# Patient Record
Sex: Female | Born: 1955 | Race: White | Hispanic: No | Marital: Married | Smoking: Never smoker
Health system: Southern US, Community
[De-identification: ages and names within clinical notes are randomized; demographics above are authoritative.]

## PROBLEM LIST (undated history)

## (undated) DIAGNOSIS — E781 Pure hyperglyceridemia: Secondary | ICD-10-CM

## (undated) DIAGNOSIS — I10 Essential (primary) hypertension: Secondary | ICD-10-CM

## (undated) DIAGNOSIS — E785 Hyperlipidemia, unspecified: Secondary | ICD-10-CM

## (undated) HISTORY — PX: OTHER SURGICAL HISTORY: SHX169

## (undated) HISTORY — PX: TONSILLECTOMY AND ADENOIDECTOMY: SHX28

---

## 2010-05-09 ENCOUNTER — Ambulatory Visit: Payer: Self-pay | Admitting: Cardiology

## 2014-12-07 ENCOUNTER — Encounter (HOSPITAL_COMMUNITY): Payer: Self-pay

## 2014-12-07 ENCOUNTER — Inpatient Hospital Stay (HOSPITAL_COMMUNITY): Payer: PRIVATE HEALTH INSURANCE

## 2014-12-07 ENCOUNTER — Inpatient Hospital Stay (HOSPITAL_COMMUNITY): Payer: PRIVATE HEALTH INSURANCE | Admitting: Anesthesiology

## 2014-12-07 ENCOUNTER — Inpatient Hospital Stay (HOSPITAL_COMMUNITY)
Admission: AD | Admit: 2014-12-07 | Discharge: 2014-12-09 | DRG: 494 | Disposition: A | Discharge status: Home / Self Care | Payer: PRIVATE HEALTH INSURANCE | Source: Ambulatory Visit | Attending: Orthopedic Surgery | Admitting: Orthopedic Surgery

## 2014-12-07 ENCOUNTER — Encounter (HOSPITAL_COMMUNITY)
Admission: AD | Disposition: A | Discharge status: Home / Self Care | Payer: Self-pay | Source: Ambulatory Visit | Attending: Orthopedic Surgery

## 2014-12-07 DIAGNOSIS — S82852A Displaced trimalleolar fracture of left lower leg, initial encounter for closed fracture: Secondary | ICD-10-CM | POA: Diagnosis present

## 2014-12-07 DIAGNOSIS — R05 Cough: Secondary | ICD-10-CM

## 2014-12-07 DIAGNOSIS — S82892A Other fracture of left lower leg, initial encounter for closed fracture: Secondary | ICD-10-CM

## 2014-12-07 DIAGNOSIS — M25572 Pain in left ankle and joints of left foot: Secondary | ICD-10-CM | POA: Diagnosis present

## 2014-12-07 DIAGNOSIS — R059 Cough, unspecified: Secondary | ICD-10-CM

## 2014-12-07 DIAGNOSIS — E785 Hyperlipidemia, unspecified: Secondary | ICD-10-CM | POA: Diagnosis present

## 2014-12-07 DIAGNOSIS — W19XXXA Unspecified fall, initial encounter: Secondary | ICD-10-CM | POA: Diagnosis present

## 2014-12-07 DIAGNOSIS — I10 Essential (primary) hypertension: Secondary | ICD-10-CM | POA: Diagnosis present

## 2014-12-07 HISTORY — PX: ORIF ANKLE FRACTURE: SHX5408

## 2014-12-07 HISTORY — DX: Pure hyperglyceridemia: E78.1

## 2014-12-07 HISTORY — DX: Essential (primary) hypertension: I10

## 2014-12-07 HISTORY — DX: Hyperlipidemia, unspecified: E78.5

## 2014-12-07 LAB — SURGICAL PCR SCREEN
MRSA, PCR: NEGATIVE
STAPHYLOCOCCUS AUREUS: NEGATIVE

## 2014-12-07 SURGERY — OPEN REDUCTION INTERNAL FIXATION (ORIF) ANKLE FRACTURE
Anesthesia: General | Site: Ankle | Laterality: Left

## 2014-12-07 MED ORDER — FENTANYL CITRATE (PF) 250 MCG/5ML IJ SOLN
INTRAMUSCULAR | Status: AC
  Filled 2014-12-07: qty 5

## 2014-12-07 MED ORDER — ONDANSETRON HCL 4 MG/2ML IJ SOLN
4.0000 mg | Freq: Four times a day (QID) | INTRAMUSCULAR | Status: DC | PRN
  Administered 2014-12-07 – 2014-12-09 (×3): 4 mg via INTRAVENOUS
  Filled 2014-12-07 (×3): qty 2

## 2014-12-07 MED ORDER — CEFAZOLIN SODIUM-DEXTROSE 2-3 GM-% IV SOLR
2.0000 g | INTRAVENOUS | Status: AC
  Administered 2014-12-07: 2 g via INTRAVENOUS

## 2014-12-07 MED ORDER — ALUM & MAG HYDROXIDE-SIMETH 200-200-20 MG/5ML PO SUSP
30.0000 mL | ORAL | Status: DC | PRN
  Administered 2014-12-09: 30 mL via ORAL
  Filled 2014-12-07: qty 30

## 2014-12-07 MED ORDER — OXYCODONE-ACETAMINOPHEN 5-325 MG PO TABS
1.0000 | ORAL_TABLET | ORAL | Status: DC | PRN
  Administered 2014-12-07 – 2014-12-09 (×6): 1 via ORAL
  Filled 2014-12-07 (×6): qty 1

## 2014-12-07 MED ORDER — FENTANYL CITRATE (PF) 100 MCG/2ML IJ SOLN: 25.0000 ug | INTRAMUSCULAR | Status: DC | PRN

## 2014-12-07 MED ORDER — NEOSTIGMINE METHYLSULFATE 10 MG/10ML IV SOLN
INTRAVENOUS | Status: DC | PRN
  Administered 2014-12-07 (×2): 2 mg via INTRAVENOUS

## 2014-12-07 MED ORDER — GLYCOPYRROLATE 0.2 MG/ML IJ SOLN
INTRAMUSCULAR | Status: DC | PRN
  Administered 2014-12-07 (×2): 0.3 mg via INTRAVENOUS

## 2014-12-07 MED ORDER — FENOFIBRATE 54 MG PO TABS
54.0000 mg | ORAL_TABLET | Freq: Every day | ORAL | Status: DC
  Administered 2014-12-08 – 2014-12-09 (×2): 54 mg via ORAL
  Filled 2014-12-07 (×6): qty 1

## 2014-12-07 MED ORDER — CEFAZOLIN SODIUM-DEXTROSE 2-3 GM-% IV SOLR
2.0000 g | Freq: Four times a day (QID) | INTRAVENOUS | Status: AC
  Administered 2014-12-07 – 2014-12-08 (×2): 2 g via INTRAVENOUS
  Filled 2014-12-07 (×2): qty 50

## 2014-12-07 MED ORDER — PROPOFOL 10 MG/ML IV BOLUS
INTRAVENOUS | Status: DC | PRN
  Administered 2014-12-07: 140 mg via INTRAVENOUS

## 2014-12-07 MED ORDER — DEXTROSE 5 % IV SOLN
500.0000 mg | Freq: Four times a day (QID) | INTRAVENOUS | Status: DC | PRN
  Filled 2014-12-07: qty 5

## 2014-12-07 MED ORDER — SODIUM CHLORIDE 0.9 % IV SOLN
INTRAVENOUS | Status: DC
  Administered 2014-12-07 (×2): via INTRAVENOUS

## 2014-12-07 MED ORDER — BUPIVACAINE-EPINEPHRINE (PF) 0.5% -1:200000 IJ SOLN
INTRAMUSCULAR | Status: AC
  Filled 2014-12-07: qty 60

## 2014-12-07 MED ORDER — ONDANSETRON HCL 4 MG/2ML IJ SOLN
4.0000 mg | Freq: Once | INTRAMUSCULAR | Status: AC
  Administered 2014-12-07: 4 mg via INTRAVENOUS

## 2014-12-07 MED ORDER — LIDOCAINE HCL (PF) 1 % IJ SOLN
INTRAMUSCULAR | Status: AC
  Filled 2014-12-07: qty 5

## 2014-12-07 MED ORDER — SODIUM CHLORIDE 0.9 % IR SOLN
Status: DC | PRN
  Administered 2014-12-07: 1500 mL

## 2014-12-07 MED ORDER — PRAVASTATIN SODIUM 40 MG PO TABS
40.0000 mg | ORAL_TABLET | Freq: Every day | ORAL | Status: DC
  Administered 2014-12-07 – 2014-12-08 (×2): 40 mg via ORAL
  Filled 2014-12-07 (×2): qty 1

## 2014-12-07 MED ORDER — FENTANYL CITRATE (PF) 100 MCG/2ML IJ SOLN
INTRAMUSCULAR | Status: DC | PRN
  Administered 2014-12-07: 50 ug via INTRAVENOUS
  Administered 2014-12-07: 25 ug via INTRAVENOUS
  Administered 2014-12-07: 50 ug via INTRAVENOUS

## 2014-12-07 MED ORDER — POLYETHYLENE GLYCOL 3350 17 G PO PACK
17.0000 g | PACK | Freq: Every day | ORAL | Status: DC | PRN
  Filled 2014-12-07: qty 1

## 2014-12-07 MED ORDER — LIDOCAINE HCL 1 % IJ SOLN
INTRAMUSCULAR | Status: DC | PRN
  Administered 2014-12-07: 30 mg via INTRADERMAL

## 2014-12-07 MED ORDER — METHOCARBAMOL 500 MG PO TABS
500.0000 mg | ORAL_TABLET | Freq: Four times a day (QID) | ORAL | Status: DC | PRN
  Administered 2014-12-08 (×2): 500 mg via ORAL
  Filled 2014-12-07 (×2): qty 1

## 2014-12-07 MED ORDER — MIDAZOLAM HCL 2 MG/2ML IJ SOLN
INTRAMUSCULAR | Status: AC
  Filled 2014-12-07: qty 2

## 2014-12-07 MED ORDER — ROCURONIUM BROMIDE 100 MG/10ML IV SOLN
INTRAVENOUS | Status: DC | PRN
  Administered 2014-12-07: 5 mg via INTRAVENOUS
  Administered 2014-12-07: 35 mg via INTRAVENOUS

## 2014-12-07 MED ORDER — ATENOLOL 25 MG PO TABS
50.0000 mg | ORAL_TABLET | Freq: Every day | ORAL | Status: DC
  Administered 2014-12-07 – 2014-12-08 (×2): 50 mg via ORAL
  Filled 2014-12-07 (×3): qty 2

## 2014-12-07 MED ORDER — HYDROMORPHONE HCL 1 MG/ML IJ SOLN
0.5000 mg | INTRAMUSCULAR | Status: DC | PRN
Start: 2014-12-07 — End: 2014-12-09
  Administered 2014-12-07 – 2014-12-08 (×3): 1 mg via INTRAVENOUS
  Filled 2014-12-07 (×3): qty 1

## 2014-12-07 MED ORDER — ALBUTEROL SULFATE (2.5 MG/3ML) 0.083% IN NEBU
2.5000 mg | INHALATION_SOLUTION | Freq: Once | RESPIRATORY_TRACT | Status: AC
  Administered 2014-12-07: 2.5 mg via RESPIRATORY_TRACT

## 2014-12-07 MED ORDER — MIDAZOLAM HCL 2 MG/2ML IJ SOLN
1.0000 mg | INTRAMUSCULAR | Status: DC | PRN
  Administered 2014-12-07: 2 mg via INTRAVENOUS

## 2014-12-07 MED ORDER — LACTATED RINGERS IV SOLN
INTRAVENOUS | Status: DC
  Administered 2014-12-07: 14:00:00 via INTRAVENOUS

## 2014-12-07 MED ORDER — ONDANSETRON HCL 4 MG/2ML IJ SOLN
INTRAMUSCULAR | Status: AC
  Filled 2014-12-07: qty 2

## 2014-12-07 MED ORDER — ASPIRIN EC 325 MG PO TBEC
325.0000 mg | DELAYED_RELEASE_TABLET | Freq: Every day | ORAL | Status: DC
  Administered 2014-12-08 – 2014-12-09 (×2): 325 mg via ORAL
  Filled 2014-12-07 (×2): qty 1

## 2014-12-07 MED ORDER — BUPIVACAINE-EPINEPHRINE (PF) 0.5% -1:200000 IJ SOLN
INTRAMUSCULAR | Status: AC
  Filled 2014-12-07: qty 30

## 2014-12-07 MED ORDER — MEPERIDINE HCL 50 MG/ML IJ SOLN: 6.2500 mg | Freq: Once | INTRAMUSCULAR | Status: DC

## 2014-12-07 MED ORDER — PROPOFOL 10 MG/ML IV BOLUS
INTRAVENOUS | Status: AC
  Filled 2014-12-07: qty 20

## 2014-12-07 MED ORDER — CHLORHEXIDINE GLUCONATE 4 % EX LIQD: 60.0000 mL | Freq: Once | CUTANEOUS | Status: DC

## 2014-12-07 MED ORDER — SENNA 8.6 MG PO TABS
1.0000 | ORAL_TABLET | Freq: Two times a day (BID) | ORAL | Status: DC
  Administered 2014-12-07 – 2014-12-09 (×4): 8.6 mg via ORAL
  Filled 2014-12-07 (×4): qty 1

## 2014-12-07 MED ORDER — ALBUTEROL SULFATE (2.5 MG/3ML) 0.083% IN NEBU: 2.5000 mg | INHALATION_SOLUTION | Freq: Once | RESPIRATORY_TRACT | Status: DC

## 2014-12-07 MED ORDER — ALBUTEROL SULFATE (2.5 MG/3ML) 0.083% IN NEBU
INHALATION_SOLUTION | RESPIRATORY_TRACT | Status: AC
  Filled 2014-12-07: qty 3

## 2014-12-07 MED ORDER — ONDANSETRON HCL 4 MG/2ML IJ SOLN: 4.0000 mg | Freq: Once | INTRAMUSCULAR | Status: DC | PRN

## 2014-12-07 MED ORDER — TEMAZEPAM 15 MG PO CAPS: 15.0000 mg | ORAL_CAPSULE | Freq: Every evening | ORAL | Status: DC | PRN

## 2014-12-07 MED ORDER — TRIAMTERENE-HCTZ 37.5-25 MG PO TABS
1.0000 | ORAL_TABLET | Freq: Every day | ORAL | Status: DC
  Administered 2014-12-07 – 2014-12-09 (×2): 1 via ORAL
  Filled 2014-12-07 (×3): qty 1

## 2014-12-07 MED ORDER — CEFAZOLIN SODIUM 1-5 GM-% IV SOLN
INTRAVENOUS | Status: AC
  Filled 2014-12-07: qty 100

## 2014-12-07 MED ORDER — BUPIVACAINE-EPINEPHRINE (PF) 0.5% -1:200000 IJ SOLN
INTRAMUSCULAR | Status: DC | PRN
  Administered 2014-12-07: 60 mL via PERINEURAL

## 2014-12-07 MED ORDER — MIDAZOLAM HCL 5 MG/5ML IJ SOLN
INTRAMUSCULAR | Status: DC | PRN
  Administered 2014-12-07: 1 mg via INTRAVENOUS

## 2014-12-07 SURGICAL SUPPLY — 70 items
BAG HAMPER (MISCELLANEOUS) ×2 IMPLANT
BANDAGE ELASTIC 3 VELCRO NS (GAUZE/BANDAGES/DRESSINGS) ×2 IMPLANT
BANDAGE ELASTIC 4 VELCRO NS (GAUZE/BANDAGES/DRESSINGS) ×4 IMPLANT
BANDAGE ESMARK 4X12 BL STRL LF (DISPOSABLE) ×1 IMPLANT
BIT DRILL 2.5X110 QC LCP DISP (BIT) ×2 IMPLANT
BIT DRILL 2.8 (BIT)
BIT DRILL CANN QC 2.8X165 (BIT) IMPLANT
BIT DRILL QC 3.5X110 (BIT) ×2 IMPLANT
BLADE 10 SAFETY STRL DISP (BLADE) IMPLANT
BNDG COHESIVE 4X5 TAN STRL (GAUZE/BANDAGES/DRESSINGS) ×2 IMPLANT
BNDG ESMARK 4X12 BLUE STRL LF (DISPOSABLE) ×2
CHLORAPREP W/TINT 26ML (MISCELLANEOUS) ×2 IMPLANT
CLOTH BEACON ORANGE TIMEOUT ST (SAFETY) ×2 IMPLANT
COVER LIGHT HANDLE STERIS (MISCELLANEOUS) ×8 IMPLANT
CUFF TOURNIQUET SINGLE 34IN LL (TOURNIQUET CUFF) ×2 IMPLANT
CUFF TOURNIQUET SINGLE 44IN (TOURNIQUET CUFF) IMPLANT
DRAPE C-ARM FOLDED MOBILE STRL (DRAPES) ×2 IMPLANT
DRAPE PROXIMA HALF (DRAPES) ×2 IMPLANT
DRILL BIT 2.8MM (BIT)
GAUZE SPONGE 4X4 12PLY STRL (GAUZE/BANDAGES/DRESSINGS) IMPLANT
GAUZE XEROFORM 5X9 LF (GAUZE/BANDAGES/DRESSINGS) ×2 IMPLANT
GLOVE BIOGEL M 7.0 STRL (GLOVE) ×2 IMPLANT
GLOVE ECLIPSE 6.5 STRL STRAW (GLOVE) ×2 IMPLANT
GLOVE INDICATOR 7.0 STRL GRN (GLOVE) ×4 IMPLANT
GLOVE SKINSENSE NS SZ8.0 LF (GLOVE) ×1
GLOVE SKINSENSE STRL SZ8.0 LF (GLOVE) ×1 IMPLANT
GLOVE SS N UNI LF 8.5 STRL (GLOVE) ×2 IMPLANT
GOWN STRL REUS W/TWL LRG LVL3 (GOWN DISPOSABLE) ×6 IMPLANT
GOWN STRL REUS W/TWL XL LVL3 (GOWN DISPOSABLE) ×2 IMPLANT
INST SET MINOR BONE (KITS) ×2 IMPLANT
K-WIRE 1.25 TRCR POINT 150 (WIRE)
K-WIRE 1.4X100 (WIRE) ×4
K-WIRE 1.6X150 (WIRE)
K-WIRE 2.0X150M (WIRE)
KIT ROOM TURNOVER APOR (KITS) ×2 IMPLANT
KWIRE 1.25 TRCR POINT 150 (WIRE) IMPLANT
KWIRE 1.4X100 (WIRE) ×2 IMPLANT
KWIRE 1.6X150 (WIRE) IMPLANT
KWIRE 2.0X150M (WIRE) IMPLANT
MANIFOLD NEPTUNE II (INSTRUMENTS) ×2 IMPLANT
NEEDLE HYPO 21X1.5 SAFETY (NEEDLE) ×2 IMPLANT
NS IRRIG 1000ML POUR BTL (IV SOLUTION) ×2 IMPLANT
PACK BASIC LIMB (CUSTOM PROCEDURE TRAY) ×2 IMPLANT
PAD ABD 5X9 TENDERSORB (GAUZE/BANDAGES/DRESSINGS) ×4 IMPLANT
PAD ARMBOARD 7.5X6 YLW CONV (MISCELLANEOUS) ×2 IMPLANT
PAD CAST 4YDX4 CTTN HI CHSV (CAST SUPPLIES) ×1 IMPLANT
PADDING CAST COTTON 4X4 STRL (CAST SUPPLIES) ×1
PADDING WEBRIL 4 STERILE (GAUZE/BANDAGES/DRESSINGS) ×4 IMPLANT
PLATE 7H 96MM (Plate) ×2 IMPLANT
SCREW 3.5X10MM (Screw) ×2 IMPLANT
SCREW BONE 14MMX3.5MM (Screw) ×2 IMPLANT
SCREW BONE 18 (Screw) ×2 IMPLANT
SCREW BONE 3.5X20MM (Screw) ×2 IMPLANT
SCREW BONE NON-LCKING 3.5X12MM (Screw) ×4 IMPLANT
SCREW LOCKING 3.5X16MM (Screw) ×2 IMPLANT
SCREW NONLOCK 22MM (Screw) ×2 IMPLANT
SET BASIN LINEN APH (SET/KITS/TRAYS/PACK) ×2 IMPLANT
SPLINT IMMOBILIZER J 3INX20FT (CAST SUPPLIES)
SPLINT J IMMOBILIZER 3X20FT (CAST SUPPLIES) IMPLANT
SPLINT J IMMOBILIZER 4X20FT (CAST SUPPLIES) ×1 IMPLANT
SPLINT J PLASTER J 4INX20Y (CAST SUPPLIES) ×1
SPONGE GAUZE 4X4 12PLY (GAUZE/BANDAGES/DRESSINGS) ×2 IMPLANT
SPONGE LAP 18X18 X RAY DECT (DISPOSABLE) ×2 IMPLANT
STAPLER VISISTAT 35W (STAPLE) ×2 IMPLANT
SUT ETHILON 3 0 FSL (SUTURE) IMPLANT
SUT MON AB 0 CT1 (SUTURE) ×2 IMPLANT
SUT MON AB 2-0 CT1 36 (SUTURE) ×2 IMPLANT
SYR 30ML LL (SYRINGE) ×2 IMPLANT
SYR BULB IRRIGATION 50ML (SYRINGE) ×2 IMPLANT
smooth K-wire ×4 IMPLANT

## 2014-12-07 NOTE — Anesthesia Procedure Notes (Signed)
Procedure Name: Intubation Date/Time: 12/07/2014 2:09 PM Performed by: Charmaine Downs Pre-anesthesia Checklist: Suction available, Patient being monitored, Emergency Drugs available and Patient identified Patient Re-evaluated:Patient Re-evaluated prior to inductionOxygen Delivery Method: Circle system utilized Preoxygenation: Pre-oxygenation with 100% oxygen Intubation Type: IV induction and Cricoid Pressure applied Ventilation: Mask ventilation without difficulty Laryngoscope Size: Mac and 3 Grade View: Grade II Tube type: Oral Tube size: 7.0 mm Number of attempts: 1 Airway Equipment and Method: Stylet and Oral airway Placement Confirmation: positive ETCO2,  ETT inserted through vocal cords under direct vision and breath sounds checked- equal and bilateral Secured at: 22 cm Tube secured with: Tape Dental Injury: Teeth and Oropharynx as per pre-operative assessment

## 2014-12-07 NOTE — H&P (Signed)
Samantha Mcconnell is an 60 y.o. female.   Chief Complaint: left ankle pain  HPI: 59 yo F fell last night admitted to [facility], transferred to [facility] secondary to [place] out for the week.  Deformity and pain medial and lateral ankle  Can not Weight Bearing   Denies numbness   Past Medical History  Diagnosis Date  . Hypertension   . Dyslipidemia   . High triglycerides     Past Surgical History  Procedure Laterality Date  . Tonsillectomy and adenoidectomy    . Endometrial surgery      Family History  Problem Relation Age of Onset  . Leukemia      CML   Social History:  reports that she has never smoked. She has never used smokeless tobacco. She reports that she does not drink alcohol or use illicit drugs.  Allergies: No Known Allergies  No prescriptions prior to admission    No results found for this or any previous visit (from the past 48 hour(s)). No results found.  Review of Systems  Respiratory: Positive for cough.        Treated for uri currently mild symptoms  All other systems reviewed and are negative.   There were no vitals taken for this visit. Physical Exam  Constitutional: She is oriented to person, place, and time. She appears well-developed and well-nourished.  HENT:  Head: Normocephalic.  Eyes: Pupils are equal, round, and reactive to light. Right eye exhibits no discharge. Left eye exhibits no discharge.  Neck: Neck supple.  Cardiovascular: Normal rate.   GI: Soft. She exhibits no distension.  Musculoskeletal:       Feet:  No abnormalities in the upper extremities   Neurological: She is alert and oriented to person, place, and time.  Skin: Skin is warm and dry.  Psychiatric: She has a normal mood and affect. Her behavior is normal. Judgment and thought content normal.     Assessment/Plan Labs from [facility] are normal   cxr and ekg needed   Will need ORIF left ankle     Arther Abbott 12/07/2014, 12:24 PM

## 2014-12-07 NOTE — Op Note (Signed)
12/07/2014  4:06 PM  PATIENT:  Samantha Mcconnell  59 y.o. female  PRE-OPERATIVE DIAGNOSIS: closed  trimalleolar left ankle fracture  POST-OPERATIVE DIAGNOSIS:  Closed trimalleolar left ankle fracture  PROCEDURE:  Procedure(s): OPEN TREATMENT INTERNAL FIXATION (ORIF) LEFT TRIMALEOR ANKLE FRACTURE (Left) w/o fixation of posterior lip   SURGEON:  Surgeon(s) and Role:    * Carole Civil, MD - Primary  PHYSICIAN ASSISTANT:   ASSISTANTS: BETTY ASHLEY    ANESTHESIA:   general  EBL:  Total I/O In: 600 [I.V.:600] Out: -   BLOOD ADMINISTERED:none  DRAINS: none   LOCAL MEDICATIONS USED:  MARCAINE     SPECIMEN:  No Specimen  DISPOSITION OF SPECIMEN:  N/A  COUNTS:  YES  TOURNIQUET:   Total Tourniquet Time Documented: Thigh (Left) - 65 minutes Total: Thigh (Left) - 65 minutes   DICTATION: .Viviann Spare Dictation  PLAN OF CARE: Admit to inpatient   PATIENT DISPOSITION:  PACU - hemodynamically stable.   Delay start of Pharmacological VTE agent (>24hrs) due to surgical blood loss or risk of bleeding: not applicable  Surgery was done in the following manner  The patient was taken to the operating room and placed under general anesthesia in the supine position with a 5 pound sandbag under the left hip. After sterile prep and drape timeout was completed.  The left lower extremity was exsanguinated with a 4 inch Esmarch and the tourniquet was elevated to 275 mmHg and the tourniquet was inflated. It stayed inflated for 65 minutes.  A slightly anterior lateral incision was performed and taken down to bone. The peroneal fascia was bluntly dissected and the fracture was exposed. After irrigation of the fracture manual reduction was performed and the fracture was held with bone clamps and a radiograph was obtained. The fracture was reduced mortise was intact the fibula was out to length. An interfragmentary screw was placed in lag fashion using AO technique with a 3.5 screw over  drilling the proximal hole with a 45 drill bit.  X-ray confirmed reduction and then a lateral plate contoured by hand was then placed using regular screws and one distal screw placed in oblique angle in locking fashion to lock the plate in place.  Radiographs confirm reduction of posterior malleolus improved reduction medial malleolus an excellent reduction of lateral malleolus.  That wound was irrigated and closed with 0 Monocryl and staples.  The medial side was then opened with a straight incision taken down to bone irrigation of the joint was performed. The medial malleolus was severely fragmented and had to be pinned in place after reduction taking care to align the medial joint radiographs confirmed pin placement. The pins were backed out bent and then pushed back into bone. Final radiographs confirmed all 3 malleolar were reduced joint was reduced and the mortise was reduced  The medial wound was irrigated closed with 2-0 Monocryl and staples. Xeroform and sterile dressings were applied  A posterior splint was applied with the foot in neutral position. The tourniquet was released before the splint was applied and before the dressings were placed.  We did inject Marcaine 60 mL around the ankle joint so there may be some postoperative numbness but this will help with pain  Postop plan 6 weeks nonweightbearing primarily because of the posterior malleolar fragment. We would like 4 weeks of range of motion exercises after 2 weeks of rest to let the soft tissues heal. And then at 6 weeks if x-rays are appropriate and there are no complications  we can start weightbearing in the brace.

## 2014-12-07 NOTE — Anesthesia Preprocedure Evaluation (Addendum)
Anesthesia Evaluation  Patient identified by MRN, date of birth, ID band Patient awake    Reviewed: Allergy & Precautions, NPO status , Patient's Chart, lab work & pertinent test results  Airway Mallampati: II  TM Distance: >3 FB Neck ROM: Full    Dental  (+) Teeth Intact   Pulmonary  Recent URI vs allergies, mild cough, raspy voice breath sounds clear to auscultation        Cardiovascular hypertension, Pt. on medications Rhythm:Regular Rate:Normal     Neuro/Psych    GI/Hepatic GERD- (ocassional)  ,  Endo/Other    Renal/GU      Musculoskeletal   Abdominal   Peds  Hematology   Anesthesia Other Findings   Reproductive/Obstetrics                           Anesthesia Physical Anesthesia Plan  ASA: II and emergent  Anesthesia Plan: General   Post-op Pain Management:    Induction: Intravenous, Rapid sequence and Cricoid pressure planned  Airway Management Planned: Oral ETT  Additional Equipment:   Intra-op Plan:   Post-operative Plan: Extubation in OR  Informed Consent: I have reviewed the patients History and Physical, chart, labs and discussed the procedure including the risks, benefits and alternatives for the proposed anesthesia with the patient or authorized representative who has indicated his/her understanding and acceptance.     Plan Discussed with:   Anesthesia Plan Comments:        Anesthesia Quick Evaluation

## 2014-12-07 NOTE — Transfer of Care (Signed)
Immediate Anesthesia Transfer of Care Note  Patient: Samantha Mcconnell [facility]  Procedure(s) Performed: Procedure(s): OPEN TREATMENT INTERNAL FIXATION (ORIF) LEFT TRIMALEOR ANKLE FRACTURE (Left)  Patient Location: PACU  Anesthesia Type:General  Level of Consciousness: awake and patient cooperative  Airway & Oxygen Therapy: Patient Spontanous Breathing and Patient connected to face mask oxygen  Post-op Assessment: Report given to RN, Post -op Vital signs reviewed and stable and Patient moving all extremities  Post vital signs: Reviewed and stable  Last Vitals:  Filed Vitals:   12/07/14 1127  BP: 131/65  Pulse: 73  Temp: 36.6 C  Resp: 16    Complications: No apparent anesthesia complications

## 2014-12-07 NOTE — Anesthesia Postprocedure Evaluation (Signed)
  Anesthesia Post-op Note  Patient: Samantha Mcconnell [facility]  Procedure(s) Performed: Procedure(s): OPEN TREATMENT INTERNAL FIXATION (ORIF) LEFT TRIMALEOR ANKLE FRACTURE (Left)  Patient Location: PACU  Anesthesia Type:General  Level of Consciousness: awake, alert , oriented and patient cooperative  Airway and Oxygen Therapy: Patient Spontanous Breathing  Post-op Pain: 3 /10, mild  Post-op Assessment: Post-op Vital signs reviewed, Patient's Cardiovascular Status Stable, Respiratory Function Stable, Patent Airway, No signs of Nausea or vomiting and Pain level controlled  Post-op Vital Signs: Reviewed and stable  Last Vitals:  Filed Vitals:   12/07/14 1613  BP: 115/55  Pulse: 70  Temp: 36.9 C  Resp: 16    Complications: No apparent anesthesia complications

## 2014-12-07 NOTE — Brief Op Note (Signed)
12/07/2014  4:06 PM  PATIENT:  Samantha Mcconnell  59 y.o. female  PRE-OPERATIVE DIAGNOSIS: closed  trimalleolar left ankle fracture  POST-OPERATIVE DIAGNOSIS:  Closed trimalleolar left ankle fracture  PROCEDURE:  Procedure(s): OPEN TREATMENT INTERNAL FIXATION (ORIF) LEFT TRIMALEOR ANKLE FRACTURE (Left) w/o fixation of posterior lip   SURGEON:  Surgeon(s) and Role:    * Carole Civil, MD - Primary  PHYSICIAN ASSISTANT:   ASSISTANTS: BETTY ASHLEY    ANESTHESIA:   general  EBL:  Total I/O In: 600 [I.V.:600] Out: -   BLOOD ADMINISTERED:none  DRAINS: none   LOCAL MEDICATIONS USED:  MARCAINE     SPECIMEN:  No Specimen  DISPOSITION OF SPECIMEN:  N/A  COUNTS:  YES  TOURNIQUET:   Total Tourniquet Time Documented: Thigh (Left) - 65 minutes Total: Thigh (Left) - 65 minutes   DICTATION: .Viviann Spare Dictation  PLAN OF CARE: Admit to inpatient   PATIENT DISPOSITION:  PACU - hemodynamically stable.   Delay start of Pharmacological VTE agent (>24hrs) due to surgical blood loss or risk of bleeding: not applicable  Surgery was done in the following manner  The patient was taken to the operating room and placed under general anesthesia in the supine position with a 5 pound sandbag under the left hip. After sterile prep and drape timeout was completed.  The left lower extremity was exsanguinated with a 4 inch Esmarch and the tourniquet was elevated to 275 mmHg and the tourniquet was inflated. It stayed inflated for 65 minutes.  A slightly anterior lateral incision was performed and taken down to bone. The peroneal fascia was bluntly dissected and the fracture was exposed. After irrigation of the fracture manual reduction was performed and the fracture was held with bone clamps and a radiograph was obtained. The fracture was reduced mortise was intact the fibula was out to length. An interfragmentary screw was placed in lag fashion using AO technique with a 3.5 screw over  drilling the proximal hole with a 45 drill bit.  X-ray confirmed reduction and then a lateral plate contoured by hand was then placed using regular screws and one distal screw placed in oblique angle in locking fashion to lock the plate in place.  Radiographs confirm reduction of posterior malleolus improved reduction medial malleolus an excellent reduction of lateral malleolus.  That wound was irrigated and closed with 0 Monocryl and staples.  The medial side was then opened with a straight incision taken down to bone irrigation of the joint was performed. The medial malleolus was severely fragmented and had to be pinned in place after reduction taking care to align the medial joint radiographs confirmed pin placement. The pins were backed out bent and then pushed back into bone. Final radiographs confirmed all 3 malleolar were reduced joint was reduced and the mortise was reduced  The medial wound was irrigated closed with 2-0 Monocryl and staples. Xeroform and sterile dressings were applied  A posterior splint was applied with the foot in neutral position. The tourniquet was released before the splint was applied and before the dressings were placed.  We did inject Marcaine 60 mL around the ankle joint so there may be some postoperative numbness but this will help with pain  Postop plan 6 weeks nonweightbearing primarily because of the posterior malleolar fragment. We would like 4 weeks of range of motion exercises after 2 weeks of rest to let the soft tissues heal. And then at 6 weeks if x-rays are appropriate and there are no complications  we can start weightbearing in the brace.

## 2014-12-08 LAB — GLUCOSE, CAPILLARY: Glucose-Capillary: 103 mg/dL — ABNORMAL HIGH (ref 70–99)

## 2014-12-08 MED ORDER — KETOROLAC TROMETHAMINE 30 MG/ML IJ SOLN
30.0000 mg | Freq: Four times a day (QID) | INTRAMUSCULAR | Status: DC
  Administered 2014-12-08 – 2014-12-09 (×6): 30 mg via INTRAVENOUS
  Filled 2014-12-08 (×6): qty 1

## 2014-12-08 NOTE — Care Management Utilization Note (Signed)
UR completed 

## 2014-12-08 NOTE — Progress Notes (Signed)
Physical Therapy Treatment Patient Details Name: Samantha Mcconnell MRN: [id number] DOB: [birth year] Today's Date: 12/08/2014    History of Present Illness Pt is a 59 year old female who sustained a trimalleolar fx and underwent OTIF on 12-07-14.  She lives with her husband who is a long distance trudk Geophysicist/field seismologist and is usually out of town.  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      PT Comments    Gait with walker is improved this afternoon with increased endurance and stability.  Will plan to instruct her in gait on a step tomorrow.  Follow Up Recommendations  SNF;Home health PT     Equipment Recommendations  Rolling walker with 5" wheels;Wheelchair (measurements PT)    Recommendations for Other Services  none     Precautions / Restrictions Precautions Precautions: Fall Restrictions Weight Bearing Restrictions: Yes LLE Weight Bearing: Non weight bearing    Mobility  Bed Mobility Overal bed mobility: Modified Independent                Transfers Overall transfer level: Modified independent Equipment used: Rolling walker (2 wheeled)                Ambulation/Gait Ambulation/Gait assistance: Min assist Ambulation Distance (Feet): 20 Feet (20'x1, 50'x1) Assistive device: Rolling walker (2 wheeled)   Gait velocity: appropriate for situation   General Gait Details: pt instructed in correct gait technique...has improved stability with the proper step length and walker position...she continues to "hop" during gait and is unable to  swing through   [employer] Mobility    Modified Rankin (Stroke Patients Only)       Balance Overall balance assessment: No apparent balance deficits (not formally assessed)                                  Cognition Arousal/Alertness: Awake/alert Behavior During Therapy: WFL for tasks assessed/performed Overall Cognitive Status: Within  Functional Limits for tasks assessed                      Exercises      General Comments        Pertinent Vitals/Pain Pain Assessment: No/denies pain Pain Score: 2  Pain Location: left ankle Pain Descriptors / Indicators: Aching Pain Intervention(s): Limited activity within patient's tolerance;Premedicated before session    Home Living Family/patient expects to be discharged to:: Unsure Living Arrangements: Spouse/significant other             Additional Comments: pt would like to d/c to IP Rehab or SNF preferrably if approved by insurance...otherwise will need to d/c to home with HHPT    Prior Function Level of Independence: Independent          PT Goals (current goals can now be found in the care plan section) Acute Rehab PT Goals Patient Stated Goal: return to independence PT Goal Formulation: With patient/family Time For Goal Achievement: 12/15/14 Potential to Achieve Goals: Good Progress towards PT goals: Progressing toward goals    Frequency  7X/week    PT Plan Current plan remains appropriate    Co-evaluation             End of Session Equipment Utilized During Treatment: Gait belt Activity Tolerance: Patient tolerated treatment well Patient left: in bed;with call bell/phone within reach  Time: 1146-4314 PT Time Calculation (min) (ACUTE ONLY): 28 min  Charges:  $Gait Training: 23-37 mins                    G Codes:      Sable Feil 12/28/14, 1:29 PM

## 2014-12-08 NOTE — Care Management Note (Addendum)
    Page 1 of 2   12/09/2014     2:23:27 PM CARE MANAGEMENT NOTE 12/09/2014  Patient:  Samantha Mcconnell, Samantha Mcconnell   Account Number:  [id number]  Date Initiated:  12/08/2014  Documentation initiated by:  Jolene Provost  Subjective/Objective Assessment:   Pt is from home, independent at baseline. Pt admitted for surgery to repair trimalleolar left ankle fracture. Pt prefers to discharge to inpatient rehab, pt info faxed to [facility] IR - they are not accepting new pt's until Monday     Action/Plan:   and [facility] IR who says pt is not a candidate for IR. Pt's second choice is SNF. CSW has been referred and will assist with placement. Will cont to follow, if pt dishcarges home, may need HH.   Anticipated DC Date:  12/10/2014   Anticipated DC Plan:  SKILLED NURSING FACILITY  In-house referral  Clinical Social Worker      DC Forensic scientist  CM consult      [facility] Choice  HOME HEALTH   Choice offered to / List presented to:  C-1 Patient        [place] arranged  HH-2 PT      [place] agency  [place]   Status of service:  Completed, signed off Medicare Important Message given?   (If response is "NO", the following Medicare IM given date fields will be blank) Date Medicare IM given:   Medicare IM given by:   Date Additional Medicare IM given:   Additional Medicare IM given by:    Discharge Disposition:  [place]  Per UR Regulation:    If discussed at Long Length of Stay Meetings, dates discussed:    Comments:  12/09/2014 [place], RN, MSN, CM Pt now to discharge home with sister and [facility]. Pt has chosen [facility]. Information and demographics faxed to [facility]. Pt will be staying with sister in [place]. Pt has no DME needs at this time, will follow with PT at home. No further CM needs.  12/08/2014 [place], RN, MSN, CM

## 2014-12-08 NOTE — Anesthesia Postprocedure Evaluation (Signed)
  Anesthesia Post-op Note  Patient: Samantha Mcconnell [facility]  Procedure(s) Performed: Procedure(s): OPEN TREATMENT INTERNAL FIXATION (ORIF) LEFT TRIMALEOR ANKLE FRACTURE (Left)  Patient Location: room 327  Anesthesia Type:General  Level of Consciousness: awake, alert , oriented and patient cooperative  Airway and Oxygen Therapy: Patient Spontanous Breathing  Post-op Pain: 5 /10, moderate  Post-op Assessment: Post-op Vital signs reviewed, Patient's Cardiovascular Status Stable, Respiratory Function Stable, Patent Airway, No signs of Nausea or vomiting and Adequate PO intake  Post-op Vital Signs: Reviewed and stable  Last Vitals:  Filed Vitals:   12/08/14 0821  BP: 112/57  Pulse: 63  Temp:   Resp:     Complications: No apparent anesthesia complications

## 2014-12-08 NOTE — Progress Notes (Signed)
Subjective: THROBBING   Objective: Vital signs in last 24 hours: Temp:  [97.9 F (36.6 C)-98.8 F (37.1 C)] 98 F (36.7 C) (04/28 0708) Pulse Rate:  [63-78] 63 (04/28 0708) Resp:  [12-22] 18 (04/28 0708) BP: (92-131)/(48-70) 103/49 mmHg (04/28 0708) SpO2:  [90 %-100 %] 94 % (04/28 0708)  Intake/Output from previous day: 04/27 0701 - 04/28 0700 In: 2281.3 [P.O.:240; I.V.:2041.3] Out: -  Intake/Output this shift:     Neurologically intact DRY SPLINT NO DRAINAGE , SENSATION NORMAL   Assessment/Plan: UP WITH PT D/C IV FLUIDS  ADD TORADOL   Arther Abbott 12/08/2014, 8:12 AM

## 2014-12-08 NOTE — Progress Notes (Signed)
[facility] inpatient rehab admissions - I received a referral for acute inpatient rehab admission.  Noted PT recommending SNF versus CIR.  Patient is transferring at mod I and ambulating min assist 15 ft.  She is already doing too well to justify an acute inpatient rehab admission.  Also, I do not feel she meets the medical necessity requirements for an acute inpatient rehab admission.  Her needs can be met at a lower level of care, such as in a SNF.  Call me if you have any questions.  I have shared my recommendations with the unit case manager.  #[phone]

## 2014-12-08 NOTE — Clinical Social Work Note (Signed)
Clinical Social Work Assessment  Patient Details  Name: Samantha Mcconnell MRN: [id number] Date of Birth: 1955/10/10  Date of referral:  12/08/14               Reason for consult:  Facility Placement                Permission sought to share information with:  Other Permission granted to share information::  Yes, Verbal Permission Granted  Name::     friend in room with pt's permission  Agency::     Relationship::     Contact Information:     Housing/Transportation Living arrangements for the past 2 months:  Single Family Home Source of Information:  Patient Patient Interpreter Needed:  None Criminal Activity/Legal Involvement Pertinent to Current Situation/Hospitalization:  No - Comment as needed Significant Relationships:  Spouse, Friend Lives with:  Spouse Do you feel safe going back to the place where you live?  Yes Need for family participation in patient care:  Yes (Comment)  Care giving concerns:  Pt's husband is a truck driver and gone during the week. Pt's sister is willing to help pt as needed.    Social Worker assessment / plan:  CSW met with pt and pt's friend with permission. Pt alert and oriented and reports she fell off the ledge of her carport and fractured her ankle. She works full time at [employer] and is very independent at baseline. She has supportive family/friends, but her husband is out of town during the week as he is a Administrator. Her sister has offered to help pt, but pt requests CIR, SNF, and home with home health in that order. Pt would feel better about being at facility for assistance if needed before returning home. CM made referral to CIR. Pt is aware of insurance authorization process and requests [facility]. She has already notified admissions there and they will begin authorization request.   Employment status:  [employer] Insurance information:  Managed Care PT Recommendations:  Inpatient Rehab Consult, Home with [place], [place] / Referral to community resources:  [place]  Patient/Family's Response to care:  Pt appears somewhat overwhelmed by situation and figuring out best d/c plan. She is open to CIR, SNF, and will go home if needed, but is hesitant as she is nervous about her ability to manage while non-weightbearing.   Patient/Family's Understanding of and Emotional Response to Diagnosis, Current Treatment, and Prognosis:  Pt is well aware of recommendations for 6 weeks non-weightbearing and that she will be out of work for some time. She feels this adjustment will be "awful" as she is used to being active. Pt became tearful as she shared that she has never been out of work for long and is worried about how they will manage. Her friend who was present also used to work at [employer] and assured pt that they will figure it out. Pt also concerned about future bone density scan as recommended by physician and possibility of osteoporosis. CSW provided support.   Emotional Assessment Appearance:  Appears stated age, Well-Groomed Attitude/Demeanor/Rapport:  Other (somewhat overwhelmed ) Affect (typically observed):  Tearful/Crying, Appropriate, Pleasant Orientation:  Oriented to Self, Oriented to Place, Oriented to  Time, Oriented to Situation Alcohol / Substance use:  Not Applicable Psych involvement (Current and /or in the community):  No (Comment)  Discharge Needs  Concerns to be addressed:  Discharge Planning Concerns Readmission within the last 30 days:  No Current discharge risk:  None Barriers to Discharge:  Insurance Authorization   Salome Arnt, LCSW 12/08/2014, 11:52 AM [phone]

## 2014-12-08 NOTE — Progress Notes (Signed)
OT Screen  Patient Details Name: Samantha Mcconnell MRN: [id number] DOB: [birth year]   OT Screen: Reason evaluation not completed:  Pt screened. Pt awake, alert, and oriented x4 this am, sister present. Pt emotional about discharge plans, husband is a truck driver and is not home during the week, therefore no assistance at home. Pt wants to go to SNF for rehab, as she does not feel she will be able to function at home. No further acute OT needs, recommend OT eval at next venue of care.    Guadelupe Sabin, OTR/L  [phone]  12/08/2014, 8:53 AM

## 2014-12-08 NOTE — Addendum Note (Signed)
Addendum  created 12/08/14 1223 by Charmaine Downs, CRNA   Modules edited: Notes Section   Notes Section:  File: [id number]

## 2014-12-08 NOTE — Evaluation (Signed)
Physical Therapy Evaluation Patient Details Name: Samantha Mcconnell MRN: [id number] DOB: Jun 26, 1956 Today's Date: 12/08/2014   History of Present Illness  Pt is a 59 year old female who sustained a trimalleolar fx and underwent OTIF on 12-07-14.  She lives with her husband who is a long distance trudk Geophysicist/field seismologist and is usually out of town.  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    Clinical Impression   Pt was seen for evaluation.  She is very alert and cooperative with pain in the left ankle well controlled.  She was instructed in transfer in and out of bed and in gait with a walker, NWB left.  She does well with transfers.  Gait with a walker is unstable with multiple gait abnormalities.  Endurance is not yet functional.  If she needs to d/c to home at d/c she will need a wheelchair with left elevating legrest as well as a walker.    Follow Up Recommendations SNF;Home health PT (prefers Inpatient Rehab in [place], then SNF, then [place])    Equipment Recommendations  Rolling walker with 5" wheels;Wheelchair (measurements PT)    Recommendations for Other Services   none    Precautions / Restrictions Precautions Precautions: Fall Restrictions Weight Bearing Restrictions: Yes LLE Weight Bearing: Non weight bearing      Mobility  Bed Mobility Overal bed mobility: Modified Independent                Transfers Overall transfer level: Modified independent Equipment used: Rolling walker (2 wheeled)                Ambulation/Gait Ambulation/Gait assistance: Min assist Ambulation Distance (Feet): 15 Feet Assistive device: Rolling walker (2 wheeled)   Gait velocity: appropriate for situation   General Gait Details: pt tends to hop with the RLE during gait, tends to walk too quickly and at times gets too close to the front bar of the walker....she was instructed in correct technique but will need more practice for  consistency...she is currently not stable with gait                       Balance Overall balance assessment: No apparent balance deficits (not formally assessed)                                           Pertinent Vitals/Pain Pain Assessment: 0-10 Pain Score: 2  Pain Location: left ankle Pain Descriptors / Indicators: Aching Pain Intervention(s): Limited activity within patient's tolerance;Premedicated before session    Home Living Family/patient expects to be discharged to:: Unsure Living Arrangements: Spouse/significant other               Additional Comments: pt would like to d/c to IP Rehab or SNF preferrably if approved by insurance...otherwise will need to d/c to home with HHPT    Prior Function Level of Independence: Independent                       Extremity/Trunk Assessment   Upper Extremity Assessment: Defer to OT evaluation           Lower Extremity Assessment: Overall WFL for tasks assessed (LLE in a posterior resting splint at the ankle)      Cervical / Trunk Assessment: Normal  Communication   Communication: No  difficulties  Cognition Arousal/Alertness: Awake/alert Behavior During Therapy: WFL for tasks assessed/performed Overall Cognitive Status: Within Functional Limits for tasks assessed                                    Assessment/Plan    PT Assessment Patient needs continued PT services  PT Diagnosis Difficulty walking;Acute pain   PT Problem List Decreased activity tolerance;Decreased mobility;Decreased knowledge of use of DME;Decreased safety awareness;Pain  PT Treatment Interventions DME instruction;Gait training;Stair training   PT Goals (Current goals can be found in the Care Plan section) Acute Rehab PT Goals Patient Stated Goal: return to independence PT Goal Formulation: With patient/family Time For Goal Achievement: 12/15/14 Potential to Achieve Goals: Good     Frequency 7X/week   Barriers to discharge Decreased caregiver support has one step to enter home                   End of Session Equipment Utilized During Treatment: Gait belt Activity Tolerance: Patient tolerated treatment well;Patient limited by fatigue Patient left: in chair;with call bell/phone within reach;with family/visitor present Nurse Communication: Mobility status         Time: 0722-5750 PT Time Calculation (min) (ACUTE ONLY): 34 min   Charges:   PT Evaluation $Initial PT Evaluation Tier I: 1 Procedure PT Treatments $Gait Training: 8-22 mins         Sable Feil 12/08/2014, 10:43 AM

## 2014-12-08 NOTE — Clinical Social Work Placement (Signed)
   CLINICAL SOCIAL WORK PLACEMENT  NOTE  Date:  12/08/2014  Patient Details  Name: Samantha Mcconnell MRN: [id number] Date of Birth: Jun 18, 1956  Clinical Social Work is seeking post-discharge placement for this patient at the [place] level of care (*CSW will initial, date and re-position this form in  chart as items are completed):  Yes   Patient/family provided with [facility] Work Department's list of facilities offering this level of care within the geographic area requested by the patient (or if unable, by the patient's family).  Yes   Patient/family informed of their freedom to choose among providers that offer the needed level of care, that participate in Medicare, Medicaid or managed care program needed by the patient, have an available bed and are willing to accept the patient.  Yes   Patient/family informed of [facility]'s ownership interest in [facility] and [facility], as well as of the fact that they are under no obligation to receive care at these facilities.  PASRR submitted to EDS on 12/08/14     PASRR number received on 12/08/14     Existing PASRR number confirmed on       FL2 transmitted to all facilities in geographic area requested by pt/family on 12/08/14     FL2 transmitted to all facilities within larger geographic area on     Patient informed that his/her managed care company has contracts with or will negotiate with certain facilities, including the following:            Patient/family informed of bed offers received.  Patient chooses bed at       Physician recommends and patient chooses bed at      Patient to be transferred to   on  .  Patient to be transferred to facility by       Patient family notified on   of transfer.  Name of family member notified:        PHYSICIAN       Additional Comment:    _______________________________________________ Salome Arnt, LCSW 12/08/2014, 11:45  AM [phone]

## 2014-12-09 ENCOUNTER — Encounter (HOSPITAL_COMMUNITY): Payer: Self-pay | Admitting: Orthopedic Surgery

## 2014-12-09 MED ORDER — PROMETHAZINE HCL 12.5 MG PO TABS: 12.5000 mg | ORAL_TABLET | Freq: Four times a day (QID) | ORAL | Status: DC | PRN

## 2014-12-09 MED ORDER — POLYETHYLENE GLYCOL 3350 17 G PO PACK: 17.0000 g | PACK | Freq: Every day | ORAL | Status: DC | PRN

## 2014-12-09 MED ORDER — OXYCODONE-ACETAMINOPHEN 5-325 MG PO TABS: 1.0000 | ORAL_TABLET | ORAL | Status: DC | PRN

## 2014-12-09 MED ORDER — SENNA 8.6 MG PO TABS: 1.0000 | ORAL_TABLET | Freq: Two times a day (BID) | ORAL | Status: DC

## 2014-12-09 MED ORDER — IBUPROFEN 800 MG PO TABS: 800.0000 mg | ORAL_TABLET | Freq: Three times a day (TID) | ORAL | Status: DC | PRN

## 2014-12-09 NOTE — Discharge Instructions (Signed)
Bimalleolar Fracture, Ankle, Adult, Displaced (ORIF) A bimalleolar fracture (break in bone) is two fractures in the lower bones of your leg that help to make up your ankle. These fractures are in the bone you feel as the bump on the outside of your ankle (fibula) and the bone that you feel as the bump on the inside of your ankle (tibia). Your fractures are displaced. This means the bones are not in their normal position and will not give a good result if they heal in that position. Because of this, surgery is required. This is called an open reduction and internal fixation (ORIF). Even with the best of care and perfect results this ankle may be more prone to be arthritis later due to damage of the cartilage lining the ankle joint which is not visible on X-ray. These fractures are easily diagnosed with X-rays. TREATMENT  You have fractures that would probably heal with disability, without surgery. Open reduction means that the area of the fracture is opened up to the vision of the surgeon and internal fixation means that a screw, pins or fixation device is used to hold the boney pieces in place. Following surgery a short-leg cast or removable fracture boot is then applied from your toes to below your knee. This is generally left in place for about 5 to 6 weeks, during which time it is followed by your caregiver and X-rays may be taken to make sure the bones stay in place. RISKS & COMPLICATIONS: All surgery is associated with risks. Some of these risks are:  Excessive bleeding  Infection  Failure to heal properly resulting in an unstable or arthritic ankle  Stiffness of ankle following repair [place] may resume normal diet and activities as directed or allowed.  Do not drive a vehicle until your caregiver specifically tells you it is safe to do so.  Keep ice packs (a bag of ice wrapped in a towel) on the surgical area for 20 minutes, 4 times per day, for the first two days  following surgery. Use the ice only if okay with your surgeon or caregiver.  Elevate your ankle above your heart as much as possible for the first 24 to 48 hours after the operation.  Change dressings if necessary or as directed.  If you have a plaster or fiberglass cast:  Do not try to scratch the skin under the cast using sharp or pointed objects.  Check the skin around the cast every day. You may put lotion on any red or sore areas.  Keep your cast dry and clean.  Do not put pressure on any part of your cast or splint until it is fully hardened.  Your cast or splint can be protected during bathing with a plastic bag. Do not lower the cast or splint into water.  Only take over-the-counter or prescription medicines for pain, discomfort, or fever as directed by your caregiver.  Use crutches as directed and do not exercise leg unless instructed.  These are not fractures to be taken lightly! If these bones become displaced and get out of position, it may eventually lead to arthritis and disability for the rest of your life. Problems often follow even the best of care. Follow the directions of your caregiver.  Keep appointments as directed. SEEK IMMEDIATE MEDICAL CARE IF:   Redness, swelling, numbness, or increasing pain in the wound.  Pus coming from wound.  An unexplained oral temperature above 102 F (38.9 C) or as  your caregiver suggests.  A bad smell coming from the wound or dressing.  A breaking open of the wound (edges not staying together) after sutures or staples have been removed.  Your skin or nails below the injury turn blue or gray, or feel cold or numb.  You develop severe pain under the cast or in your foot. Especially when someone else moves your toes. Follow all instructions given to you by your caregiver, make and keep follow up appointments, and use crutches as directed. If you do not have a window in your cast for observing the wound, a discharge, or minor  bleeding may show up as a stain on the outside of your dressings, your cast, or plaster splint. Report these findings to your caregiver. MAKE SURE YOU:   Understand these instructions.  Will watch your condition.  Will get help right away if you are not doing well or get worse. Document Released: 05/08/2005 Document Revised: 10/21/2011 Document Reviewed: 03/02/2008 [facility] Patient Information 2015 [place], [place]. This information is not intended to replace advice given to you by your health care provider. Make sure you discuss any questions you have with your health care provider.   MAKE SURE YOU ARRANGE A F/U APPT WITH YOUR PRIMARY DOCTOR TO ADDRESS THE CHEST XRAY FINDING DURING THIS ADMISSION

## 2014-12-09 NOTE — Plan of Care (Signed)
Problem: Discharge Progression Outcomes Goal: Other Discharge Outcomes/Goals Outcome: Completed/Met Date Met:  12/09/14 Home health will see the patient

## 2014-12-09 NOTE — Plan of Care (Signed)
Problem: Phase II Progression Outcomes Goal: Progress activity as tolerated unless otherwise ordered Outcome: Progressing Pt up to bedside commode.  Goal: Vital signs stable Pt blood pressure is still running low.

## 2014-12-09 NOTE — Progress Notes (Signed)
Physical Therapy Treatment Patient Details Name: Samantha Mcconnell MRN: [id number] DOB: 02/16/1956 Today's Date: 12/09/2014    History of Present Illness Pt is a 59 year old female who sustained a trimalleolar fx and underwent OTIF on 12-07-14.  She lives with her husband who is a long distance trudk Geophysicist/field seismologist and is usually out of town.  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      PT Comments    Pt was feeling well with no significant ankle pain.  She is more stable in gait with a walker.  She was instructed in gait on a single step, NWB left.  She needed close guarding.  Follow Up Recommendations  SNF;Home health PT     Equipment Recommendations  Wheelchair (measurements PT) (sister found a walker for pt to use)    Recommendations for Other Services  none     Precautions / Restrictions Precautions Precautions: Fall Restrictions Weight Bearing Restrictions: Yes LLE Weight Bearing: Non weight bearing    Mobility  Bed Mobility Overal bed mobility: Modified Independent                Transfers Overall transfer level: Modified independent Equipment used: Rolling walker (2 wheeled)                Ambulation/Gait Ambulation/Gait assistance: Min guard Ambulation Distance (Feet): 30 Feet Assistive device: Rolling walker (2 wheeled) Gait Pattern/deviations: WFL(Within Functional Limits) Gait velocity: appropriate for situation       Stairs Stairs: Yes Stairs assistance: Min guard Stair Management: No rails;Backwards Number of Stairs: 1 General stair comments: pt has only 1 step to enter her home...if she goes to her sister's home, there are 3-4 steps with railing.  I demonstrated several techniques of stair climbing to her but since we only had a full flight of steps to practice on, she would be unable to physically ascend these NWE.Marland KitchenMarland KitchenI advised her to get a portable ramp if she goes to her sister's  home  Wheelchair Mobility    Modified Rankin (Stroke Patients Only)       Balance Overall balance assessment: No apparent balance deficits (not formally assessed)                                  Cognition Arousal/Alertness: Awake/alert Behavior During Therapy: WFL for tasks assessed/performed Overall Cognitive Status: Within Functional Limits for tasks assessed                      Exercises      General Comments        Pertinent Vitals/Pain Pain Assessment: No/denies pain    Home Living                      Prior Function            PT Goals (current goals can now be found in the care plan section) Progress towards PT goals: Progressing toward goals;PT to reassess next treatment    Frequency  7X/week    PT Plan Current plan remains appropriate;Equipment recommendations need to be updated    Co-evaluation             End of Session Equipment Utilized During Treatment: Gait belt Activity Tolerance: Patient tolerated treatment well Patient left: in chair;with call bell/phone within reach     Time: 7026-3785 PT Time  Calculation (min) (ACUTE ONLY): 49 min  Charges:  $Gait Training: 23-37 mins                    G Codes:      Sable Feil 2014-12-12, 10:25 AM

## 2014-12-09 NOTE — Progress Notes (Signed)
Pt d/c today. Her sister was in room as well and they have decided that pt will stay with her sister for awhile until able to return home. CM notified for home health/equipment needs. Pt states she has already notified Counsellor at [facility]. CSW will sign off.  Benay Pike, [place]

## 2014-12-09 NOTE — Discharge Summary (Signed)
Physician Discharge Summary  Patient ID: Samantha Mcconnell MRN: [id number] DOB/AGE: 01/17/1956 59 y.o.  Admit date: 12/07/2014 Discharge date: 12/09/2014  Admission Diagnoses: Trimalleolar ankle fracture left lower extremity, closed  Discharge diagnosis same  Active problems trimalleolar fracture left ankle  Patient also had abnormal chest x-ray thought to be pneumonia needs further follow-up  Hospital course unremarkable. The patient was admitted as a transfer directly from [facility]" request of Dr. Ronne Binning on April 27 she had surgery later that afternoon for trimalleolar ankle fracture with medial and lateral fixation. She tolerated that well. She was started on a physical therapy program and tolerated that well and was able to ambulate nonweightbearing with a walker.  Discharge condition improved  Discharge Diagnoses:  Active Problems:   Trimalleolar fracture of left ankle  Procedure open treatment internal fixation left ankle with Stryker lateral plate and medial pin fixation   Discharge Exam: Blood pressure 107/53, pulse 62, temperature 98.2 F (36.8 C), temperature source Oral, resp. rate 20, height 5\' 6"  (1.676 m), weight 160 lb (72.576 kg), SpO2 97 %. General appearance: alert and cooperative Neurologic: Grossly normal Splint was intact without drainage   Disposition: Final discharge disposition not confirmed  Discharge Instructions    Ambulatory referral to [facility]    Complete by:  As directed   Please evaluate Samantha Mcconnell for [facility] to [facility].  Disciplines requested: Physical Therapy  Services to provide: Strengthening Exercises and Other: gait training   Physician to follow patient's care (the person listed here will be responsible for signing ongoing orders): Referring Provider  Requested Start of Care Date: Tomorrow  I certify that this patient is under my care and that I, or a Nurse Practitioner or Physician's Assistant working  with me, had a face-to-face encounter that meets the physician face-to-face requirements with patient on 4.29.16. The encounter with the patient was in whole, or in part for the following medical condition(s) which is the primary reason for home health care (List medical condition). Left ankle fracture   Special Instructions:  No weight on operative leg  Does the patient have Medicare or Medicaid?:  No  The encounter with the patient was in whole, or in part, for the following medical condition, which is the primary reason for home health care:  left ankle trimalleolar fracture  Reason for Medically Necessary Home Health Services:  Therapy- Personnel officer, Public librarian  My clinical findings support the need for the above services:  Pain interferes with ambulation/mobility  I certify that, based on my findings, the following services are medically necessary home health services:  Physical therapy  Further, I certify that my clinical findings support that this patient is homebound due to:  Unable to leave home safely without assistance     Call MD / Call 911    Complete by:  As directed   If you experience chest pain or shortness of breath, CALL 911 and be transported to the hospital emergency room.  If you develope a fever above 101 F, pus (white drainage) or increased drainage or redness at the wound, or calf pain, call your surgeon's office.     Constipation Prevention    Complete by:  As directed   Drink plenty of fluids.  Prune juice may be helpful.  You may use a stool softener, such as Colace (over the counter) 100 mg twice a day.  Use MiraLax (over the counter) for constipation as needed.     Diet -  low sodium heart healthy    Complete by:  As directed      Discharge instructions    Complete by:  As directed   Keep foot elevated when not walking  If your toes are swelling MORE ICE MORE ELEVATION / call the doctor if it does not go down    Keep your foot off the  ground ie. No weight bearing on the operative leg     For home use only DME standard manual wheelchair with seat cushion    Complete by:  As directed   Patient suffers from ankle fracture  which impairs their ability to perform daily activities like dressing, feeding, grooming and toileting in the home.  A walker will not resolve  issue with performing activities of daily living. A wheelchair will allow patient to safely perform daily activities. Patient can safely propel the wheelchair in the home or has a caregiver who can provide assistance.  Accessories: elevating leg rests (ELRs), wheel locks, extensions and anti-tippers.     Increase activity slowly as tolerated    Complete by:  As directed             Medication List    TAKE these medications        atenolol 50 MG tablet  Commonly known as:  TENORMIN  Take 50 mg by mouth daily.     fenofibrate 145 MG tablet  Commonly known as:  TRICOR  Take 145 mg by mouth daily.     ibuprofen 800 MG tablet  Commonly known as:  ADVIL,MOTRIN  Take 1 tablet (800 mg total) by mouth every 8 (eight) hours as needed.     omeprazole 40 MG capsule  Commonly known as:  PRILOSEC  Take 40 mg by mouth daily as needed (heartburn).     oxyCODONE-acetaminophen 5-325 MG per tablet  Commonly known as:  PERCOCET/ROXICET  Take 1 tablet by mouth every 4 (four) hours as needed for severe pain.     polyethylene glycol packet  Commonly known as:  MIRALAX / GLYCOLAX  Take 17 g by mouth daily as needed for mild constipation.     pravastatin 40 MG tablet  Commonly known as:  PRAVACHOL  Take 40 mg by mouth at bedtime.     promethazine 12.5 MG tablet  Commonly known as:  PHENERGAN  Take 1 tablet (12.5 mg total) by mouth every 6 (six) hours as needed for nausea or vomiting.     senna 8.6 MG Tabs tablet  Commonly known as:  SENOKOT  Take 1 tablet (8.6 mg total) by mouth 2 (two) times daily.     triamterene-hydrochlorothiazide 37.5-25 MG per tablet   Commonly known as:  MAXZIDE-25  Take 1 tablet by mouth daily.           Follow-up Information    Follow up with Arther Abbott, MD On 12/20/2014.   Specialties:  Orthopedic Surgery, Radiology   Why:  xrays and change splint to cast or boot    Contact information:   [address] [place] [address] [phone]      postop visit #1 will be scheduled for May 10 for removal of splint, removal of sutures and staples, x-ray. Decision will be made boot versus cast  Signed: Arther Abbott 12/09/2014, 1:02 PM

## 2014-12-19 ENCOUNTER — Encounter: Payer: Self-pay | Admitting: Orthopedic Surgery

## 2014-12-20 ENCOUNTER — Ambulatory Visit (HOSPITAL_COMMUNITY)
Admission: RE | Admit: 2014-12-20 | Discharge: 2014-12-20 | Disposition: A | Discharge status: Home / Self Care | Payer: PRIVATE HEALTH INSURANCE | Source: Ambulatory Visit | Attending: Orthopedic Surgery | Admitting: Orthopedic Surgery

## 2014-12-20 ENCOUNTER — Ambulatory Visit (INDEPENDENT_AMBULATORY_CARE_PROVIDER_SITE_OTHER): Payer: PRIVATE HEALTH INSURANCE | Admitting: Orthopedic Surgery

## 2014-12-20 ENCOUNTER — Other Ambulatory Visit: Payer: Self-pay | Admitting: Orthopedic Surgery

## 2014-12-20 ENCOUNTER — Encounter: Payer: Self-pay | Admitting: Orthopedic Surgery

## 2014-12-20 VITALS — BP 132/74 | Ht 66.0 in | Wt 160.0 lb

## 2014-12-20 DIAGNOSIS — W19XXXD Unspecified fall, subsequent encounter: Secondary | ICD-10-CM | POA: Diagnosis not present

## 2014-12-20 DIAGNOSIS — S82842A Displaced bimalleolar fracture of left lower leg, initial encounter for closed fracture: Secondary | ICD-10-CM

## 2014-12-20 DIAGNOSIS — S82842D Displaced bimalleolar fracture of left lower leg, subsequent encounter for closed fracture with routine healing: Secondary | ICD-10-CM | POA: Diagnosis not present

## 2014-12-20 DIAGNOSIS — S82892S Other fracture of left lower leg, sequela: Secondary | ICD-10-CM

## 2014-12-20 NOTE — Progress Notes (Signed)
No chief complaint on file.   BP 132/74 mmHg  Ht 5\' 6"  (1.676 m)  Wt 160 lb (72.576 kg)  BMI 25.84 kg/m2  Postop visit #1 status post "reduction internal fixation left bimalleolar ankle fracture on 12/07/2014  Patient had x-rays at our the before meals  Not complaining of pain not requiring any pain medications she's been nonweightbearing  All staples were removed the wounds look clean she did have a desquamation of the skin over the anterior portion of the medial wound. Her foot is plantigrade. We placed her in a short leg cast. Her x-rays show fracture stabilization with plate and screws and medial pins with no complications  Follow-up 4 weeks x-ray out of plaster  No weightbearing

## 2015-01-10 ENCOUNTER — Telehealth: Payer: Self-pay | Admitting: Orthopedic Surgery

## 2015-01-10 NOTE — Telephone Encounter (Signed)
Toes staying swollen and discolored, elevating does relieve discoloration but only helps swelling some, advised that some swelling and discoloration is to be expected and to continue elevating. Any further suggestions?

## 2015-01-10 NOTE — Telephone Encounter (Signed)
Patient would like to speak to the nurse, she has questions regarding left ankle, having swelling and some discoloration, please advise?

## 2015-01-10 NOTE — Telephone Encounter (Signed)
Come in for check if shes not satisfied with that answer   Weds am is fine

## 2015-01-11 ENCOUNTER — Ambulatory Visit (INDEPENDENT_AMBULATORY_CARE_PROVIDER_SITE_OTHER): Payer: Self-pay | Admitting: Orthopedic Surgery

## 2015-01-11 ENCOUNTER — Encounter: Payer: Self-pay | Admitting: Orthopedic Surgery

## 2015-01-11 VITALS — BP 125/67 | Ht 66.0 in | Wt 160.0 lb

## 2015-01-11 DIAGNOSIS — S82841D Displaced bimalleolar fracture of right lower leg, subsequent encounter for closed fracture with routine healing: Secondary | ICD-10-CM

## 2015-01-11 NOTE — Progress Notes (Signed)
Chief Complaint  Patient presents with  . Foot Problem    toe swelling and discoloration, left foot    April 27 date of surgery  Open treatment internal fixation left ankle Encounter Diagnosis  Name Primary?  Marland Kitchen Ankle fracture, bimalleolar, closed, right, with routine healing, subsequent encounter Yes    The patient's toes look fine however she complained of sunburn under her cast or feeling like the dorsum and plantar aspect had sunburn so I remove the cast her wounds look fine I placed her in a Cam Walker nonweightbearing x-ray in a week

## 2015-01-17 ENCOUNTER — Encounter: Payer: Self-pay | Admitting: Orthopedic Surgery

## 2015-01-17 ENCOUNTER — Ambulatory Visit (INDEPENDENT_AMBULATORY_CARE_PROVIDER_SITE_OTHER): Payer: Self-pay | Admitting: Orthopedic Surgery

## 2015-01-17 ENCOUNTER — Ambulatory Visit (INDEPENDENT_AMBULATORY_CARE_PROVIDER_SITE_OTHER): Payer: PRIVATE HEALTH INSURANCE

## 2015-01-17 VITALS — BP 141/75 | Ht 66.0 in | Wt 160.0 lb

## 2015-01-17 DIAGNOSIS — S82841D Displaced bimalleolar fracture of right lower leg, subsequent encounter for closed fracture with routine healing: Secondary | ICD-10-CM

## 2015-01-17 DIAGNOSIS — S82892A Other fracture of left lower leg, initial encounter for closed fracture: Secondary | ICD-10-CM

## 2015-01-17 MED ORDER — HYDROCODONE-ACETAMINOPHEN 5-325 MG PO TABS: 1.0000 | ORAL_TABLET | Freq: Four times a day (QID) | ORAL | Status: DC | PRN

## 2015-01-17 NOTE — Progress Notes (Signed)
Patient ID: Samantha Mcconnell, female   DOB: [birth year], 59 y.o.   MRN: [id number] Chief Complaint  Patient presents with  . Follow-up    6 week follow up + xray left ankle fx, DOS 12/07/14   BP 141/75 mmHg  Ht 5\' 6"  (1.676 m)  Wt 160 lb (72.576 kg)  BMI 25.84 kg/m2  Encounter Diagnoses  Name Primary?  Marland Kitchen Ankle fracture, left Yes  . Ankle fracture, bimalleolar, closed, right, with routine healing, subsequent encounter     This is week #6 after internal fixation bimalleolar ankle fracture she is doing well she is in a Cam Walker she's been nonweightbearing she is now again 6 weeks out. Her x-rays today show excellent fracture healing no hardware complications her wounds look good she can start weightbearing as tolerated. She can return to work Wednesday, June 29 full duty. She will start therapy 3 times a week for 3 weeks to learn how to use a cane  Come back in 6 weeks for repeat x-ray

## 2015-01-17 NOTE — Patient Instructions (Addendum)
Return to work June 29 to regular duties  Weightbearing as tolerated  Call [facility] therapy dept to schedule PT

## 2015-01-23 ENCOUNTER — Telehealth: Payer: Self-pay | Admitting: Orthopedic Surgery

## 2015-01-23 NOTE — Telephone Encounter (Signed)
Should we just bring in for wound check?

## 2015-01-23 NOTE — Telephone Encounter (Signed)
PAUL WITH [facility] HAS QUESTIONS REGARDING Samantha Mcconnell SHE IS HAVING SOME SKIN BREAKAGE AROUND SURGERY SITE, PLEASE ADVISE?

## 2015-01-24 ENCOUNTER — Encounter: Payer: Self-pay | Admitting: Orthopedic Surgery

## 2015-01-24 ENCOUNTER — Ambulatory Visit (INDEPENDENT_AMBULATORY_CARE_PROVIDER_SITE_OTHER): Payer: Self-pay | Admitting: Orthopedic Surgery

## 2015-01-24 VITALS — BP 137/74 | Ht 66.0 in | Wt 160.0 lb

## 2015-01-24 DIAGNOSIS — L03119 Cellulitis of unspecified part of limb: Secondary | ICD-10-CM

## 2015-01-24 DIAGNOSIS — S82842D Displaced bimalleolar fracture of left lower leg, subsequent encounter for closed fracture with routine healing: Secondary | ICD-10-CM

## 2015-01-24 DIAGNOSIS — S82842A Displaced bimalleolar fracture of left lower leg, initial encounter for closed fracture: Secondary | ICD-10-CM

## 2015-01-24 MED ORDER — CEPHALEXIN 500 MG PO CAPS: 500.0000 mg | ORAL_CAPSULE | Freq: Two times a day (BID) | ORAL | Status: DC

## 2015-01-24 NOTE — Telephone Encounter (Signed)
Patient is coming in today 01/24/15 at 4:00 and she is aware

## 2015-01-24 NOTE — Progress Notes (Signed)
Chief Complaint  Patient presents with  . Wound Check    wound check left ankle, DOS 12/07/14    The patient had surgery on her left ankle on 12/07/2014 she was placed in a Cam Walker after her six-week x-ray showed stable fixation wounds look good  We started her weightbearing as tolerated with a walker we center for therapy to learn how to walk with a cane.  She is at [facility] she is seeing Eddie Dibbles a therapist at [phone]  Apparently he is giving her different information than what I'm giving her so I will call him tomorrow.  She did have this around the lateral ankle wound and therefore we saw her today and start her on Keflex. She will follow-up on Monday if no improvement we will go to IV and a biotics and take new x-rays  Today the dressing was applied with Xeroform and gauze. She does have surrounding erythema medial and on both sides of the wound. No purulent drainage is noted just erythema and there is some tenderness there.

## 2015-01-30 ENCOUNTER — Ambulatory Visit (INDEPENDENT_AMBULATORY_CARE_PROVIDER_SITE_OTHER): Payer: Self-pay | Admitting: Orthopedic Surgery

## 2015-01-30 ENCOUNTER — Encounter: Payer: Self-pay | Admitting: Orthopedic Surgery

## 2015-01-30 VITALS — BP 145/86 | Ht 66.0 in | Wt 160.0 lb

## 2015-01-30 DIAGNOSIS — S82842D Displaced bimalleolar fracture of left lower leg, subsequent encounter for closed fracture with routine healing: Secondary | ICD-10-CM

## 2015-01-30 NOTE — Progress Notes (Signed)
Patient ID: Samantha Mcconnell, female   DOB: 1955/10/17, 59 y.o.   MRN: [id number] Chief Complaint  Patient presents with  . Follow-up    1 week wound check, left ankle, DOS 12/07/14    The patient had surgery on her left ankle on 12/07/2014 she was placed in a Cam Walker after her six-week x-ray showed stable fixation wounds look good  We started her weightbearing as tolerated with a walker we sent her for therapy to learn how to walk with a cane.  She is now one week post starting keflex for redness around the lateral incision  She still has a fair amount of swelling. She has some paresthesias in the foot. With knee at 6 foot is brought to neutral but she does have some Achilles tightness especially with the knee extended  The incision is much improved. We put on an adhesive dressing instead of a gauze to this top some of the slipping that was going on under the [employer]. She is to come back in a week continue her in a bionics and she can go back to work if she is approved by the company doctor  This is week #8 post injury

## 2015-02-02 ENCOUNTER — Telehealth: Payer: Self-pay | Admitting: Orthopedic Surgery

## 2015-02-02 NOTE — Telephone Encounter (Signed)
YES

## 2015-02-02 NOTE — Telephone Encounter (Signed)
Routing to nurse

## 2015-02-02 NOTE — Telephone Encounter (Signed)
Call received from physical therapist, Eddie Dibbles, at [facility] out-patient rehab; states he is working with patient, walking with cane very well; asking if Dr Aline Brochure would approve working on stair climbing.  Their office ph# is [phone]

## 2015-02-06 ENCOUNTER — Ambulatory Visit (INDEPENDENT_AMBULATORY_CARE_PROVIDER_SITE_OTHER): Payer: Self-pay | Admitting: Orthopedic Surgery

## 2015-02-06 VITALS — BP 142/74 | Ht 66.0 in | Wt 160.0 lb

## 2015-02-06 DIAGNOSIS — S82842D Displaced bimalleolar fracture of left lower leg, subsequent encounter for closed fracture with routine healing: Secondary | ICD-10-CM

## 2015-02-06 DIAGNOSIS — Z4789 Encounter for other orthopedic aftercare: Secondary | ICD-10-CM

## 2015-02-06 MED ORDER — FLUCONAZOLE 150 MG PO TABS: 150.0000 mg | ORAL_TABLET | Freq: Once | ORAL | Status: DC

## 2015-02-06 NOTE — Progress Notes (Signed)
Chief Complaint  Patient presents with  . Wound Check    1 week wound check left ankle, DOS 12/07/14    The patient is now on her second week of anabiotic's by mouth for cellulitis of the lateral ankle wound  The proximal portion is cleared up nicely the distal portion is still showing some erythema and superficial skin breakdown  The dorsum of the foot is clearing up nicely.  Her ankle motion has improved she has no pain with weightbearing  Last x-ray shows no hardware complications  Recommend continue anabiotic's. We did have to put her on Diflucan for yeast infection   Current outpatient prescriptions:  .  atenolol (TENORMIN) 50 MG tablet, Take 50 mg by mouth daily., Disp: , Rfl: 11 .  cephALEXin (KEFLEX) 500 MG capsule, Take 1 capsule (500 mg total) by mouth 2 (two) times daily., Disp: 28 capsule, Rfl: 2 .  fenofibrate (TRICOR) 145 MG tablet, Take 145 mg by mouth daily., Disp: , Rfl:  .  fluconazole (DIFLUCAN) 150 MG tablet, Take 1 tablet (150 mg total) by mouth once., Disp: 1 tablet, Rfl: 2 .  HYDROcodone-acetaminophen (NORCO/VICODIN) 5-325 MG per tablet, Take 1 tablet by mouth every 6 (six) hours as needed for moderate pain., Disp: 30 tablet, Rfl: 0 .  ibuprofen (ADVIL,MOTRIN) 800 MG tablet, Take 1 tablet (800 mg total) by mouth every 8 (eight) hours as needed., Disp: 90 tablet, Rfl: 2 .  omeprazole (PRILOSEC) 40 MG capsule, Take 40 mg by mouth daily as needed (heartburn). , Disp: , Rfl:  .  oxyCODONE-acetaminophen (PERCOCET/ROXICET) 5-325 MG per tablet, Take 1 tablet by mouth every 4 (four) hours as needed for severe pain., Disp: 72 tablet, Rfl: 0 .  polyethylene glycol (MIRALAX / GLYCOLAX) packet, Take 17 g by mouth daily as needed for mild constipation., Disp: 14 each, Rfl: 0 .  pravastatin (PRAVACHOL) 40 MG tablet, Take 40 mg by mouth at bedtime., Disp: , Rfl:  .  promethazine (PHENERGAN) 12.5 MG tablet, Take 1 tablet (12.5 mg total) by mouth every 6 (six) hours as needed for  nausea or vomiting., Disp: 60 tablet, Rfl: 2 .  senna (SENOKOT) 8.6 MG TABS tablet, Take 1 tablet (8.6 mg total) by mouth 2 (two) times daily., Disp: 120 each, Rfl: 0 .  triamterene-hydrochlorothiazide (MAXZIDE-25) 37.5-25 MG per tablet, Take 1 tablet by mouth daily., Disp: , Rfl: 11

## 2015-02-06 NOTE — Telephone Encounter (Signed)
Samantha Mcconnell [place]

## 2015-02-10 ENCOUNTER — Telehealth: Payer: Self-pay | Admitting: Orthopedic Surgery

## 2015-02-10 NOTE — Telephone Encounter (Signed)
CALLED  HAD TO LEAVE MESSAGE  OFFICE STAFF INFORMED ME YESTERDAY SHE WAS CONCERNED THAT PRE CERT HADNT BEEN DONE AND SHE WAS GETTING BILLS

## 2015-02-11 ENCOUNTER — Telehealth: Payer: Self-pay | Admitting: Orthopedic Surgery

## 2015-02-11 NOTE — Telephone Encounter (Signed)
Phone call to speak to Ms [facility] regarding her concerns , left message that I ll try again tues

## 2015-02-15 ENCOUNTER — Ambulatory Visit (INDEPENDENT_AMBULATORY_CARE_PROVIDER_SITE_OTHER): Payer: Self-pay | Admitting: Orthopedic Surgery

## 2015-02-15 VITALS — BP 145/83 | Ht 66.0 in | Wt 160.0 lb

## 2015-02-15 DIAGNOSIS — Z4789 Encounter for other orthopedic aftercare: Secondary | ICD-10-CM

## 2015-02-15 DIAGNOSIS — S82842D Displaced bimalleolar fracture of left lower leg, subsequent encounter for closed fracture with routine healing: Secondary | ICD-10-CM

## 2015-02-15 NOTE — Progress Notes (Signed)
Patient ID: Samantha Mcconnell, female   DOB: 1956/02/16, 59 y.o.   MRN: [id number]  Follow up visit  Chief Complaint  Patient presents with  . Follow-up    1 week follow up left ankle wound, DOS 12/07/14    BP 145/83 mmHg  Ht 5\' 6"  (1.676 m)  Wt 160 lb (72.576 kg)  BMI 25.84 kg/m2  No diagnosis found.  One-week follow-up left ankle wound 10 weeks follow-up open treatment internal fixation left ankle.  The wound looks great today. The skin is improved the swelling has diminished her range of motion has improved. We will allow her to continue her anabiotic's although now there are no signs of cellulitis or infection. She has returned to work she is weightbearing as tolerated in a [employer]. I will follow her in a week and if things still look good I will allow her to have physical therapy including whirlpool therapy to address the swelling  She was concerned that the insurance company told her that her surgery wasn't precertified however: #1 this was an emergency surgery #2 the facility where she was contracted to go to did not have an orthopedist on-call and she was transferred to our facility instead.  I will see her next week as scheduled and then the week after for x-ray at 12 weeks.

## 2015-02-22 ENCOUNTER — Ambulatory Visit (INDEPENDENT_AMBULATORY_CARE_PROVIDER_SITE_OTHER): Payer: PRIVATE HEALTH INSURANCE | Admitting: Orthopedic Surgery

## 2015-02-22 VITALS — BP 122/69 | Ht 66.0 in | Wt 160.0 lb

## 2015-02-22 DIAGNOSIS — Z4789 Encounter for other orthopedic aftercare: Secondary | ICD-10-CM

## 2015-02-22 DIAGNOSIS — S82842D Displaced bimalleolar fracture of left lower leg, subsequent encounter for closed fracture with routine healing: Secondary | ICD-10-CM

## 2015-02-22 NOTE — Progress Notes (Signed)
Patient ID: Samantha Mcconnell, female   DOB: 1955-09-05, 59 y.o.   MRN: [id number]  Follow up visit  Chief Complaint  Patient presents with  . Follow-up    1 week recheck on wound on left ankle, DOS 12-07-14.    BP 122/69 mmHg  Ht 5\' 6"  (1.676 m)  Wt 160 lb (72.576 kg)  BMI 25.84 kg/m2  Encounter Diagnoses  Name Primary?  Marland Kitchen Aftercare following surgery of the musculoskeletal system Yes  . Ankle fracture, bimalleolar, closed, left, with routine healing, subsequent encounter    This is a follow-up visit check on the ankle wound status post bimalleolar ankle fixation. The patient is now 11 weeks postop. She just finished a course of Keflex for a cellulitis of the ankle. The distal most portion of the wound has a small opening which is superficial and epithelial only. She has some other abrasions over the top of the foot which are healed well. She is more swollen today. She says she can't elevate the foot at work but ices it in the morning  There is no evidence of cellulitis. There is no increased tenderness.  She can take a shower now as long she pats the wound dry and covered with a Band-Aid. She can start physical therapy for range of motion exercises only they're not to do any wound care.  She will follow-up in a week she is allowed to shower with a shower chair  She is weightbearing as tolerated in the boot

## 2015-02-22 NOTE — Addendum Note (Signed)
Addended by: Baldomero Lamy B on: 02/22/2015 12:21 PM   Modules accepted: Orders

## 2015-02-22 NOTE — Addendum Note (Signed)
Addended by: Carole Civil on: 02/22/2015 09:25 AM   Modules accepted: Orders, Medications

## 2015-02-28 ENCOUNTER — Ambulatory Visit: Payer: PRIVATE HEALTH INSURANCE | Admitting: Orthopedic Surgery

## 2015-03-02 ENCOUNTER — Ambulatory Visit (INDEPENDENT_AMBULATORY_CARE_PROVIDER_SITE_OTHER): Payer: PRIVATE HEALTH INSURANCE

## 2015-03-02 ENCOUNTER — Encounter: Payer: Self-pay | Admitting: Orthopedic Surgery

## 2015-03-02 ENCOUNTER — Telehealth: Payer: Self-pay | Admitting: Orthopedic Surgery

## 2015-03-02 ENCOUNTER — Ambulatory Visit (INDEPENDENT_AMBULATORY_CARE_PROVIDER_SITE_OTHER): Payer: Self-pay | Admitting: Orthopedic Surgery

## 2015-03-02 VITALS — BP 110/63 | Ht 66.0 in | Wt 160.0 lb

## 2015-03-02 DIAGNOSIS — S82892A Other fracture of left lower leg, initial encounter for closed fracture: Secondary | ICD-10-CM | POA: Diagnosis not present

## 2015-03-02 DIAGNOSIS — Z4789 Encounter for other orthopedic aftercare: Secondary | ICD-10-CM

## 2015-03-02 MED ORDER — SULFAMETHOXAZOLE-TRIMETHOPRIM 800-160 MG PO TABS: 1.0000 | ORAL_TABLET | Freq: Two times a day (BID) | ORAL | Status: DC

## 2015-03-02 NOTE — Progress Notes (Signed)
Patient ID: Samantha Mcconnell, female   DOB: 1955/11/20, 59 y.o.   MRN: [id number]  Follow up visit  Chief Complaint  Patient presents with  . Follow-up    1 week follow up wound check left ankle, DOS 12/07/14    BP 110/63 mmHg  Ht 5\' 6"  (1.676 m)  Wt 160 lb (72.576 kg)  BMI 25.84 kg/m2  Encounter Diagnoses  Name Primary?  Marland Kitchen Ankle fracture, left   . Aftercare following surgery of the musculoskeletal system Yes    Patient comes in for her one week wound check status post open treatment internal fixation of her left ankle. A note from her therapist indicate that she is having some problems with her wound. She was previously on Keflex for wound cellulitis on the lateral side.  However, she has several areas of redness which to me looked like adhesive skin allergy. These include multiple areas over the lateral portion of the wound dorsum of the foot and now medial portion of the wound.  The only intervention that we have changed is the application of a compression hose which has helped her swelling  However she says she went to therapy on Tuesday and on Thursday she started having increased pain and had to take a pain pill where she had only been taking ibuprofen  Examination of the leg shows that her foot is plantigrade her plantar flexion is 5 of dorsiflexion is 10  The very distal portion of the wound does have redness exudate over it. There has been some drainage there she said just a small amount.  There is some tenderness over the fracture site.  Radiographs are ordered  We will put her on Bactrim 1 double strength twice a day to cover for staph.  In terms of wound care we will advise her to place a small amount of Triple Antibiotic and a Band-Aid just on that portion and I will see her in 2 weeks when I return from conference.  Dr. Luna Glasgow is covering if there are any problems while I'm away.  X-ray showed intact hardware with appropriate healing and reduction of the  mortise.

## 2015-03-02 NOTE — Telephone Encounter (Signed)
I think she's on the schedule today

## 2015-03-02 NOTE — Telephone Encounter (Signed)
Just an F.Y. I Samantha Mcconnell was seen on 02/28/15 by Samantha Mcconnell at [facility] patient was c/o more redness and pain on her foot at cellulitis site.

## 2015-03-16 ENCOUNTER — Encounter: Payer: Self-pay | Admitting: Orthopedic Surgery

## 2015-03-16 ENCOUNTER — Ambulatory Visit (INDEPENDENT_AMBULATORY_CARE_PROVIDER_SITE_OTHER): Payer: PRIVATE HEALTH INSURANCE | Admitting: Orthopedic Surgery

## 2015-03-16 VITALS — BP 114/64 | Ht 66.0 in | Wt 160.0 lb

## 2015-03-16 DIAGNOSIS — Z4789 Encounter for other orthopedic aftercare: Secondary | ICD-10-CM

## 2015-03-16 DIAGNOSIS — S82842D Displaced bimalleolar fracture of left lower leg, subsequent encounter for closed fracture with routine healing: Secondary | ICD-10-CM

## 2015-03-16 NOTE — Progress Notes (Signed)
Patient ID: Samantha Mcconnell, female   DOB: April 20, 1956, 59 y.o.   MRN: [id number]  Follow up visit  Chief Complaint  Patient presents with  . Follow-up    2 week follow up wound left ankle, DOS 12/07/14    BP 114/64 mmHg  Ht 5\' 6"  (1.676 m)  Wt 160 lb (72.576 kg)  BMI 25.84 kg/m2  Encounter Diagnoses  Name Primary?  Marland Kitchen Aftercare following surgery of the musculoskeletal system Yes  . Ankle fracture, bimalleolar, closed, left, with routine healing, subsequent encounter      the patient's wounds are doing well on the Bactrim although it is causing her some nausea.   she's undergoing physical therapy improving.   She still has some stiffness in her ankle joint.   she has clean wounds some irritation of the skin medial dorsally and laterally.  Recommend continue compression hose to control swelling. She may wear a normal shoe at this point. Follow-up in a month. Continue physical therapy 1 month.

## 2015-03-16 NOTE — Patient Instructions (Signed)
OK TO STOP WEARING BOOT FINISH ANTIBIOTIC

## 2015-03-20 ENCOUNTER — Ambulatory Visit: Payer: PRIVATE HEALTH INSURANCE | Admitting: Orthopedic Surgery

## 2015-04-18 ENCOUNTER — Ambulatory Visit (INDEPENDENT_AMBULATORY_CARE_PROVIDER_SITE_OTHER): Payer: PRIVATE HEALTH INSURANCE | Admitting: Orthopedic Surgery

## 2015-04-18 ENCOUNTER — Encounter: Payer: Self-pay | Admitting: Orthopedic Surgery

## 2015-04-18 VITALS — BP 129/68 | Ht 66.0 in | Wt 160.0 lb

## 2015-04-18 DIAGNOSIS — M25572 Pain in left ankle and joints of left foot: Secondary | ICD-10-CM | POA: Diagnosis not present

## 2015-04-18 DIAGNOSIS — S82842D Displaced bimalleolar fracture of left lower leg, subsequent encounter for closed fracture with routine healing: Secondary | ICD-10-CM

## 2015-04-18 NOTE — Progress Notes (Signed)
Patient ID: Samantha Mcconnell, female   DOB: [birth year], 59 y.o.   MRN: [id number]  Follow up visit  Chief Complaint  Patient presents with  . Follow-up    1 month follow up left ankle wound, DOS 12/07/14    BP 129/68 mmHg  Ht 5\' 6"  (1.676 m)  Wt 160 lb (72.576 kg)  BMI 25.84 kg/m2  Encounter Diagnoses  Name Primary?  . Pain in joint, ankle and foot, left Yes  . Ankle fracture, bimalleolar, closed, left, with routine healing, subsequent encounter     The patient comes in complaining of ankle tightness and metatarsophalangeal joint pain  She is in physical therapy. I think some of the exercises where she is going up on her toes is causing her some discomfort along with the fact that she has not regained full range of motion and may roll over on the metatarsophalangeal joint versus ankle joint  System review no fever chills erythema around the wound  The ankle fracture sites actually did not hurt her  Exam vital signs are stable patient awake alert and oriented 3 she has normal incisional appearance medial and lateral  She has some swelling around the ankle and tenderness over the metatarsophalangeal joints 23 and 4. She has subtalar joint restrictions as well. Her ankle dorsiflexion is near the opposite side lacking about 5  Opposite side normal. Neurovascular exam intact  Advice is to go to home therapy decrease the tiptoe exercises and do isometric plantarflexion strengthening exercises and Achilles stretching  Follow-up 2 months

## 2015-06-08 ENCOUNTER — Ambulatory Visit (INDEPENDENT_AMBULATORY_CARE_PROVIDER_SITE_OTHER): Payer: PRIVATE HEALTH INSURANCE | Admitting: Orthopedic Surgery

## 2015-06-08 ENCOUNTER — Encounter: Payer: Self-pay | Admitting: Orthopedic Surgery

## 2015-06-08 VITALS — BP 128/75 | Ht 66.0 in | Wt 162.0 lb

## 2015-06-08 DIAGNOSIS — S82842S Displaced bimalleolar fracture of left lower leg, sequela: Secondary | ICD-10-CM

## 2015-06-08 NOTE — Progress Notes (Addendum)
Patient ID: Samantha Mcconnell, female   DOB: 1956/05/10, 59 y.o.   MRN: [id number]  Follow up visit  Left ankle fracture follow-up date of surgery April 2016  BP 128/75 mmHg  Ht 5\' 6"  (1.676 m)  Wt 162 lb (73.483 kg)  BMI 26.16 kg/m2  Encounter Diagnosis  Name Primary?  Marland Kitchen Ankle fracture, bimalleolar, closed, left, sequela Yes    Patient continues to improve she's complaining of some intermittent swelling mainly when she gets up in the morning slightly worse at the end of the day. Her ankle is looking good she still got about 10 more degrees of dorsiflexion to obtain in terms of her Achilles tendon tightness  Follow-up 3 months

## 2015-09-07 ENCOUNTER — Ambulatory Visit: Payer: PRIVATE HEALTH INSURANCE | Admitting: Orthopedic Surgery

## 2016-10-30 ENCOUNTER — Other Ambulatory Visit (HOSPITAL_COMMUNITY): Payer: Self-pay | Admitting: Respiratory Therapy

## 2016-10-30 DIAGNOSIS — J45909 Unspecified asthma, uncomplicated: Secondary | ICD-10-CM

## 2016-11-15 ENCOUNTER — Ambulatory Visit (HOSPITAL_COMMUNITY)
Admission: RE | Admit: 2016-11-15 | Discharge: 2016-11-15 | Disposition: A | Discharge status: Home / Self Care | Payer: Commercial Managed Care - PPO | Source: Ambulatory Visit | Attending: Pulmonary Disease | Admitting: Pulmonary Disease

## 2016-11-15 DIAGNOSIS — J45909 Unspecified asthma, uncomplicated: Secondary | ICD-10-CM | POA: Diagnosis present

## 2016-11-17 LAB — PULMONARY FUNCTION TEST
FEF 25-75 PRE: 0.69 L/s
FEF2575-%PRED-PRE: 28 %
FEV1-%Pred-Pre: 38 %
FEV1-Pre: 1.02 L
FEV1FVC-%PRED-PRE: 93 %
FEV6-%Pred-Pre: 42 %
FEV6-Pre: 1.4 L
FEV6FVC-%PRED-PRE: 104 %
FVC-%Pred-Pre: 41 %
FVC-PRE: 1.4 L
PRE FEV1/FVC RATIO: 73 %
PRE FEV6/FVC RATIO: 100 %

## 2016-12-18 ENCOUNTER — Encounter (INDEPENDENT_AMBULATORY_CARE_PROVIDER_SITE_OTHER): Payer: Self-pay | Admitting: Internal Medicine

## 2016-12-18 ENCOUNTER — Encounter (INDEPENDENT_AMBULATORY_CARE_PROVIDER_SITE_OTHER): Payer: Self-pay

## 2017-01-01 ENCOUNTER — Encounter (INDEPENDENT_AMBULATORY_CARE_PROVIDER_SITE_OTHER): Payer: Self-pay | Admitting: Internal Medicine

## 2017-01-01 ENCOUNTER — Ambulatory Visit (INDEPENDENT_AMBULATORY_CARE_PROVIDER_SITE_OTHER): Payer: Commercial Managed Care - PPO | Admitting: Internal Medicine

## 2017-01-01 ENCOUNTER — Other Ambulatory Visit (INDEPENDENT_AMBULATORY_CARE_PROVIDER_SITE_OTHER): Payer: Self-pay | Admitting: Internal Medicine

## 2017-01-01 ENCOUNTER — Encounter (INDEPENDENT_AMBULATORY_CARE_PROVIDER_SITE_OTHER): Payer: Self-pay | Admitting: *Deleted

## 2017-01-01 VITALS — BP 130/72 | HR 72 | Temp 98.7°F | Ht 65.0 in | Wt 165.9 lb

## 2017-01-01 DIAGNOSIS — R05 Cough: Secondary | ICD-10-CM | POA: Insufficient documentation

## 2017-01-01 DIAGNOSIS — R1310 Dysphagia, unspecified: Secondary | ICD-10-CM

## 2017-01-01 DIAGNOSIS — R059 Cough, unspecified: Secondary | ICD-10-CM

## 2017-01-01 DIAGNOSIS — R1319 Other dysphagia: Secondary | ICD-10-CM | POA: Insufficient documentation

## 2017-01-01 NOTE — Patient Instructions (Signed)
EGD/ED. The risks and benefits such as perforation, bleeding, and infection were reviewed with the patient and is agreeable. 

## 2017-01-01 NOTE — Progress Notes (Signed)
   Subjective:    Patient ID: Samantha Mcconnell, female    DOB: 06-24-1956, 61 y.o.   MRN: [id number]  HPI Referred by Dr Luan Pulling for dysphagia/EGD. She has had this cough for 3 yrs.  She has seen Dr. Luan Pulling for her cough.  She also has wheezing. She is taking Protonix.  She does have some acid reflux. She has hoarseness. She said she had a piece of granola bar lodge in her esophagus last week.  She has had pills to lodge in her esophagus.  Appetite is okay. Weight loss 5 pounds since December. BMs are normal;.  Sometimes she coughs up thick saliva.  She sleeps upright in couch sometimes and also sleeps elevated on pillow.  Has appt with ENT Dr. Benjamine Mola tomorrow.  09/03/2016 Chest xray: No evidence of pneumonia nor other acute cardiopulmonary disease  11/28/2016 CT angio chest: No evidence of PE. Mild limitation. Hepatic steatosis.   12/03/2016 Modified Barium Study:Patient presents with minimal pharyngeal dysphagia characterized by flash penetration of thin liquids with cough x 1, secondary to reduced hypolaryngeal elevation/excrsion. Patient appears to present with slightly delay epiglottic inversion,, however no objective measures available. Patient presented with what appears to be stasis in the thoracic esophagus with barium sulfate pill.    Review of Systems Past Medical History:  Diagnosis Date  . Dyslipidemia   . High triglycerides   . Hypertension     Past Surgical History:  Procedure Laterality Date  . endometrial surgery    . ORIF ANKLE FRACTURE Left 12/07/2014   Procedure: OPEN TREATMENT INTERNAL FIXATION (ORIF) LEFT TRIMALEOR ANKLE FRACTURE;  Surgeon: Carole Civil, MD;  Location: [facility];  Service: Orthopedics;  Laterality: Left;  . TONSILLECTOMY AND ADENOIDECTOMY      No Known Allergies  Current Outpatient Prescriptions on File Prior to Visit  Medication Sig Dispense Refill  . atenolol (TENORMIN) 50 MG tablet Take 50 mg by mouth daily.  11  . fenofibrate (TRICOR)  145 MG tablet Take 145 mg by mouth daily.    Marland Kitchen ibuprofen (ADVIL,MOTRIN) 800 MG tablet Take 1 tablet (800 mg total) by mouth every 8 (eight) hours as needed. 90 tablet 2  . pravastatin (PRAVACHOL) 40 MG tablet Take 40 mg by mouth at bedtime.    . triamterene-hydrochlorothiazide (MAXZIDE-25) 37.5-25 MG per tablet Take 1 tablet by mouth daily.  11   No current facility-administered medications on file prior to visit.        Objective:   Physical Exam Blood pressure 130/72, pulse 72, temperature 98.7 F (37.1 C), height 5\' 5"  (1.651 m), weight 165 lb 14.4 oz (75.3 kg).  Alert and oriented. Skin warm and dry. Oral mucosa is moist.   . Sclera anicteric, conjunctivae is pink. Thyroid not enlarged. No cervical lymphadenopathy. Lungs clear. Heart regular rate and rhythm.  Abdomen is soft. Bowel sounds are positive. No hepatomegaly. No abdominal masses felt. No tenderness.  No edema to lower extremities. Patient is alert and oriented.       Assessment & Plan:  Dysphagia: EGD/ED. Stricture needs to be ruled out.  GERD: continue the Protonix for now. Cough: Will rule out reflux.

## 2017-01-02 ENCOUNTER — Ambulatory Visit (INDEPENDENT_AMBULATORY_CARE_PROVIDER_SITE_OTHER): Payer: Commercial Managed Care - PPO | Admitting: Otolaryngology

## 2017-01-02 DIAGNOSIS — R05 Cough: Secondary | ICD-10-CM | POA: Diagnosis not present

## 2017-01-03 ENCOUNTER — Encounter (INDEPENDENT_AMBULATORY_CARE_PROVIDER_SITE_OTHER): Payer: Self-pay

## 2017-01-29 ENCOUNTER — Emergency Department (HOSPITAL_COMMUNITY)
Admission: EM | Admit: 2017-01-29 | Discharge: 2017-01-29 | Disposition: A | Discharge status: Home / Self Care | Payer: Commercial Managed Care - PPO | Attending: Emergency Medicine | Admitting: Emergency Medicine

## 2017-01-29 ENCOUNTER — Encounter (HOSPITAL_COMMUNITY): Payer: Self-pay | Admitting: Emergency Medicine

## 2017-01-29 ENCOUNTER — Emergency Department (HOSPITAL_COMMUNITY): Payer: Commercial Managed Care - PPO

## 2017-01-29 DIAGNOSIS — Z79899 Other long term (current) drug therapy: Secondary | ICD-10-CM | POA: Diagnosis not present

## 2017-01-29 DIAGNOSIS — I10 Essential (primary) hypertension: Secondary | ICD-10-CM | POA: Diagnosis not present

## 2017-01-29 DIAGNOSIS — R0602 Shortness of breath: Secondary | ICD-10-CM | POA: Diagnosis not present

## 2017-01-29 LAB — BASIC METABOLIC PANEL
ANION GAP: 8 (ref 5–15)
BUN: 28 mg/dL — AB (ref 6–20)
CALCIUM: 9.4 mg/dL (ref 8.9–10.3)
CO2: 26 mmol/L (ref 22–32)
Chloride: 104 mmol/L (ref 101–111)
Creatinine, Ser: 1.11 mg/dL — ABNORMAL HIGH (ref 0.44–1.00)
GFR calc Af Amer: >60 mL/min (ref >60)
GFR calc non Af Amer: 53 mL/min — ABNORMAL LOW (ref >60)
GLUCOSE: 115 mg/dL — AB (ref 65–99)
Potassium: 3.7 mmol/L (ref 3.5–5.1)
Sodium: 138 mmol/L (ref 135–145)

## 2017-01-29 LAB — CBC
HCT: 41.8 % (ref 36.0–46.0)
HEMOGLOBIN: 14 g/dL (ref 12.0–15.0)
MCH: 32.7 pg (ref 26.0–34.0)
MCHC: 33.5 g/dL (ref 30.0–36.0)
MCV: 97.7 fL (ref 78.0–100.0)
Platelets: 367 10*3/uL (ref 150–400)
RBC: 4.28 MIL/uL (ref 3.87–5.11)
RDW: 12.3 % (ref 11.5–15.5)
WBC: 12.6 10*3/uL — ABNORMAL HIGH (ref 4.0–10.5)

## 2017-01-29 LAB — D-DIMER, QUANTITATIVE: D-Dimer, Quant: <0.27 ug/mL-FEU (ref 0.00–0.50)

## 2017-01-29 LAB — BRAIN NATRIURETIC PEPTIDE: B NATRIURETIC PEPTIDE 5: 55 pg/mL (ref 0.0–100.0)

## 2017-01-29 MED ORDER — IPRATROPIUM BROMIDE 0.02 % IN SOLN
0.5000 mg | Freq: Once | RESPIRATORY_TRACT | Status: AC
  Administered 2017-01-29: 0.5 mg via RESPIRATORY_TRACT
  Filled 2017-01-29: qty 2.5

## 2017-01-29 MED ORDER — PREDNISONE 50 MG PO TABS: 50.0000 mg | ORAL_TABLET | Freq: Every day | ORAL | 0 refills | Status: DC

## 2017-01-29 MED ORDER — ALBUTEROL SULFATE (2.5 MG/3ML) 0.083% IN NEBU
5.0000 mg | INHALATION_SOLUTION | Freq: Once | RESPIRATORY_TRACT | Status: AC
  Administered 2017-01-29: 5 mg via RESPIRATORY_TRACT
  Filled 2017-01-29: qty 6

## 2017-01-29 MED ORDER — ALBUTEROL SULFATE HFA 108 (90 BASE) MCG/ACT IN AERS
1.0000 | INHALATION_SPRAY | Freq: Four times a day (QID) | RESPIRATORY_TRACT | Status: DC | PRN
  Administered 2017-01-29: 2 via RESPIRATORY_TRACT
  Filled 2017-01-29: qty 6.7

## 2017-01-29 MED ORDER — METHYLPREDNISOLONE SODIUM SUCC 125 MG IJ SOLR
125.0000 mg | Freq: Once | INTRAMUSCULAR | Status: AC
  Administered 2017-01-29: 125 mg via INTRAVENOUS
  Filled 2017-01-29: qty 2

## 2017-01-29 NOTE — ED Provider Notes (Signed)
[facility] DEPT Provider Note   CSN: [id number] Arrival date & time: 01/29/17  1333     History   Chief Complaint Chief Complaint  Patient presents with  . Shortness of Breath    HPI Samantha Mcconnell is a 61 y.o. female.  HPI Patient presents to the emergency room for evaluation of cough and shortness of breath. Patient states she's had trouble off and on for the last couple of years. It has gotten worse since December. She has had chronic issues with cough and feeling of drainage down the back of her throat and intermittent episodes of severe shortness of breath. Patient has seen Dr. Luan Pulling, immunology, as well as Dr. Benjamine Mola ENT as part of her evaluation. Patient states she's had scans for blood clots. 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  Patient had been taking Neurontin and the symptoms had been helping. Coworkers noted that she was having difficulty breathing. Patient felt like she was wheezing. She came into the emergency room. Past Medical History:  Diagnosis Date  . Dyslipidemia   . High triglycerides   . Hypertension     Patient Active Problem List   Diagnosis Date Noted  . Cough 01/01/2017  . Esophageal dysphagia 01/01/2017  . Trimalleolar fracture of left ankle 12/07/2014    Past Surgical History:  Procedure Laterality Date  . endometrial surgery    . ORIF ANKLE FRACTURE Left 12/07/2014   Procedure: OPEN TREATMENT INTERNAL FIXATION (ORIF) LEFT TRIMALEOR ANKLE FRACTURE;  Surgeon: Carole Civil, MD;  Location: [facility];  Service: Orthopedics;  Laterality: Left;  . TONSILLECTOMY AND ADENOIDECTOMY      OB History    No data available       Home Medications    Prior to Admission medications   Medication Sig Start Date End Date Taking? Authorizing Provider  atenolol (TENORMIN) 50 MG tablet Take 50 mg by mouth daily. 11/11/14  Yes [provider]  Calcium-Phosphorus-Vitamin D  (CALCIUM/VITAMIN D3/ADULT GUMMY) 250-100-500 MG-MG-UNIT CHEW Chew by mouth.   Yes [provider]  diphenhydrAMINE (BENADRYL) 25 MG tablet Take 50 mg by mouth daily.   Yes [provider]  fenofibrate (TRICOR) 145 MG tablet Take 145 mg by mouth daily.   Yes [provider]  gabapentin (NEURONTIN) 100 MG capsule Take 100 mg by mouth 3 (three) times daily.   Yes [provider]  HYDROcodone-homatropine (HYCODAN) 5-1.5 MG/5ML syrup Take 5 mLs by mouth every 6 (six) hours as needed for cough.   Yes [provider]  ibuprofen (ADVIL,MOTRIN) 800 MG tablet Take 1 tablet (800 mg total) by mouth every 8 (eight) hours as needed. 12/09/14  Yes Carole Civil, MD  montelukast (SINGULAIR) 10 MG tablet Take 10 mg by mouth at bedtime.   Yes [provider]  omeprazole (PRILOSEC) 20 MG capsule Take 20 mg by mouth daily.   Yes [provider]  pravastatin (PRAVACHOL) 40 MG tablet Take 40 mg by mouth at bedtime.   Yes [provider]  pseudoephedrine-dextromethorphan-guaifenesin (ROBITUSSIN-PE) 30-10-100 MG/5ML solution Take 10 mLs by mouth 4 (four) times daily as needed for cough.   Yes [provider]  triamterene-hydrochlorothiazide (MAXZIDE-25) 37.5-25 MG per tablet Take 1 tablet by mouth daily. 11/11/14  Yes [provider]  budesonide-formoterol (SYMBICORT) 160-4.5 MCG/ACT inhaler Inhale 2 puffs into the lungs 2 (two) times daily.    [provider]  pantoprazole (PROTONIX) 40 MG tablet  Take 40 mg by mouth 2 (two) times daily.    [provider]  predniSONE (DELTASONE) 50 MG tablet Take 1 tablet (50 mg total) by mouth daily. 01/29/17   Dorie Rank, MD    Family History Family History  Problem Relation Age of Onset  . Leukemia Unknown        CML    Social History Social History  Substance Use Topics  . Smoking status: Never Smoker  . Smokeless tobacco: Never Used  . Alcohol use No      Allergies   Patient has no known allergies.   Review of Systems Review of Systems  All other systems reviewed and are negative.    Physical Exam Updated Vital Signs BP (!) 114/53   Pulse 83   Temp 98 F (36.7 C) (Oral)   Resp (!) 27   Ht 1.676 m (5\' 6" )   Wt 72.6 kg (160 lb)   SpO2 96%   BMI 25.82 kg/m   Physical Exam  Constitutional: She appears well-developed and well-nourished. No distress.  HENT:  Head: Normocephalic and atraumatic.  Right Ear: External ear normal.  Left Ear: External ear normal.  Eyes: Conjunctivae are normal. Right eye exhibits no discharge. Left eye exhibits no discharge. No scleral icterus.  Neck: Neck supple. No tracheal deviation present.  Cardiovascular: Normal rate, regular rhythm and intact distal pulses.   Pulmonary/Chest: Accessory muscle usage present. No stridor. Tachypnea noted. No respiratory distress. She has wheezes (with inspiration). She has no rales.  Abdominal: Soft. Bowel sounds are normal. She exhibits no distension. There is no tenderness. There is no rebound and no guarding.  Musculoskeletal: She exhibits no edema or tenderness.  Neurological: She is alert. She has normal strength. No cranial nerve deficit (no facial droop, extraocular movements intact, no slurred speech) or sensory deficit. She exhibits normal muscle tone. She displays no seizure activity. Coordination normal.  Skin: Skin is warm and dry. No rash noted.  Psychiatric: She has a normal mood and affect.  Nursing note and vitals reviewed.    ED Treatments / Results  Labs (all labs ordered are listed, but only abnormal results are displayed) Labs Reviewed  BASIC METABOLIC PANEL - Abnormal; Notable for the following:       Result Value   Glucose, Bld 115 (*)    BUN 28 (*)    Creatinine, Ser 1.11 (*)    GFR calc non Af Amer 53 (*)    All other components within normal limits  CBC - Abnormal; Notable for the following:    WBC 12.6 (*)    All other  components within normal limits  BRAIN NATRIURETIC PEPTIDE  D-DIMER, QUANTITATIVE (NOT AT [facility])    EKG  EKG Interpretation  Date/Time:  Wednesday January 29 2017 14:46:00 EDT Ventricular Rate:  78 PR Interval:    QRS Duration: 102 QT Interval:  407 QTC Calculation: 464 R Axis:   -46 Text Interpretation:  Sinus rhythm Prolonged PR interval Left anterior fascicular block Abnormal R-wave progression, late transition Borderline T abnormalities, anterior leads No significant change since last tracing Confirmed by Dorie Rank [phone]) on 01/29/2017 4:29:11 PM       Radiology Dg Chest 2 View  Result Date: 01/29/2017 CLINICAL DATA:  Chronic cough and SOB. EXAM: CHEST  2 VIEW COMPARISON:  11/28/2016 FINDINGS: The heart size and mediastinal contours are within normal limits. Both lungs are clear. The visualized skeletal structures are unremarkable. IMPRESSION: No active cardiopulmonary disease. Electronically Signed  By: Kerby Moors M.D.   On: 01/29/2017 16:22    Procedures Procedures (including critical care time)  Medications Ordered in ED Medications  albuterol (PROVENTIL HFA;VENTOLIN HFA) 108 (90 Base) MCG/ACT inhaler 1-2 puff (not administered)  albuterol (PROVENTIL) (2.5 MG/3ML) 0.083% nebulizer solution 5 mg (5 mg Nebulization Given 01/29/17 1539)  ipratropium (ATROVENT) nebulizer solution 0.5 mg (0.5 mg Nebulization Given 01/29/17 1539)  methylPREDNISolone sodium succinate (SOLU-MEDROL) 125 mg/2 mL injection 125 mg (125 mg Intravenous Given 01/29/17 1451)     Initial Impression / Assessment and Plan / ED Course  I have reviewed the triage vital signs and the nursing notes.  Pertinent labs & imaging results that were available during my care of the patient were reviewed by me and considered in my medical decision making (see chart for details).   Pt presents to the ED for evaluation of an acute on chronic cough.  No definite wheezing on my exam but she was labored when she  initially arrived.  She responded to Bronchodilators and steroids. Her laboratory tests and x-rays are unremarkable. Her symptoms improved with treatment in the emergency room. We'll discharge home on a course of steroids and a bronchodilator. We discussed follow-up with her primary care doctor to discuss further testing such as possible allergy testing  Final Clinical Impressions(s) / ED Diagnoses   Final diagnoses:  Shortness of breath    New Prescriptions New Prescriptions   PREDNISONE (DELTASONE) 50 MG TABLET    Take 1 tablet (50 mg total) by mouth daily.     Dorie Rank, MD 01/29/17 [phone]

## 2017-01-29 NOTE — ED Notes (Signed)
Patient removed blood pressure cuff.

## 2017-01-29 NOTE — ED Triage Notes (Signed)
Pt c/o chronic cough and SOB for since December. Productive cough at times. No chest pain. Wheezing noted in triage

## 2017-01-29 NOTE — Discharge Instructions (Signed)
Follow up with your primary care doctor to discuss any further testing and evaluation, take the medications as prescribed

## 2017-02-13 ENCOUNTER — Ambulatory Visit (INDEPENDENT_AMBULATORY_CARE_PROVIDER_SITE_OTHER): Payer: Commercial Managed Care - PPO | Admitting: Otolaryngology

## 2017-02-13 DIAGNOSIS — R490 Dysphonia: Secondary | ICD-10-CM

## 2017-02-13 DIAGNOSIS — K219 Gastro-esophageal reflux disease without esophagitis: Secondary | ICD-10-CM

## 2017-02-13 DIAGNOSIS — R05 Cough: Secondary | ICD-10-CM | POA: Diagnosis not present

## 2017-02-25 ENCOUNTER — Telehealth (INDEPENDENT_AMBULATORY_CARE_PROVIDER_SITE_OTHER): Payer: Self-pay | Admitting: *Deleted

## 2017-02-25 NOTE — Telephone Encounter (Signed)
okay

## 2017-02-25 NOTE — Telephone Encounter (Signed)
Patient called in -- wanted to cancel EGD/ED sch'd 03/26/17 -- she's looking for a place that will pay better with her insurance

## 2017-03-26 ENCOUNTER — Encounter (HOSPITAL_COMMUNITY): Payer: Self-pay

## 2017-03-26 ENCOUNTER — Ambulatory Visit (HOSPITAL_COMMUNITY): Admit: 2017-03-26 | Payer: Commercial Managed Care - PPO | Admitting: Internal Medicine

## 2017-03-26 SURGERY — EGD (ESOPHAGOGASTRODUODENOSCOPY)
Anesthesia: Moderate Sedation

## 2017-11-02 IMAGING — DX DG CHEST 2V
2 series · 2 of 2 positions shown · non-contrast
Comparison: 11/28/2016

CLINICAL DATA: Chronic cough and SOB.

EXAM:
CHEST  2 VIEW

[chest pa]
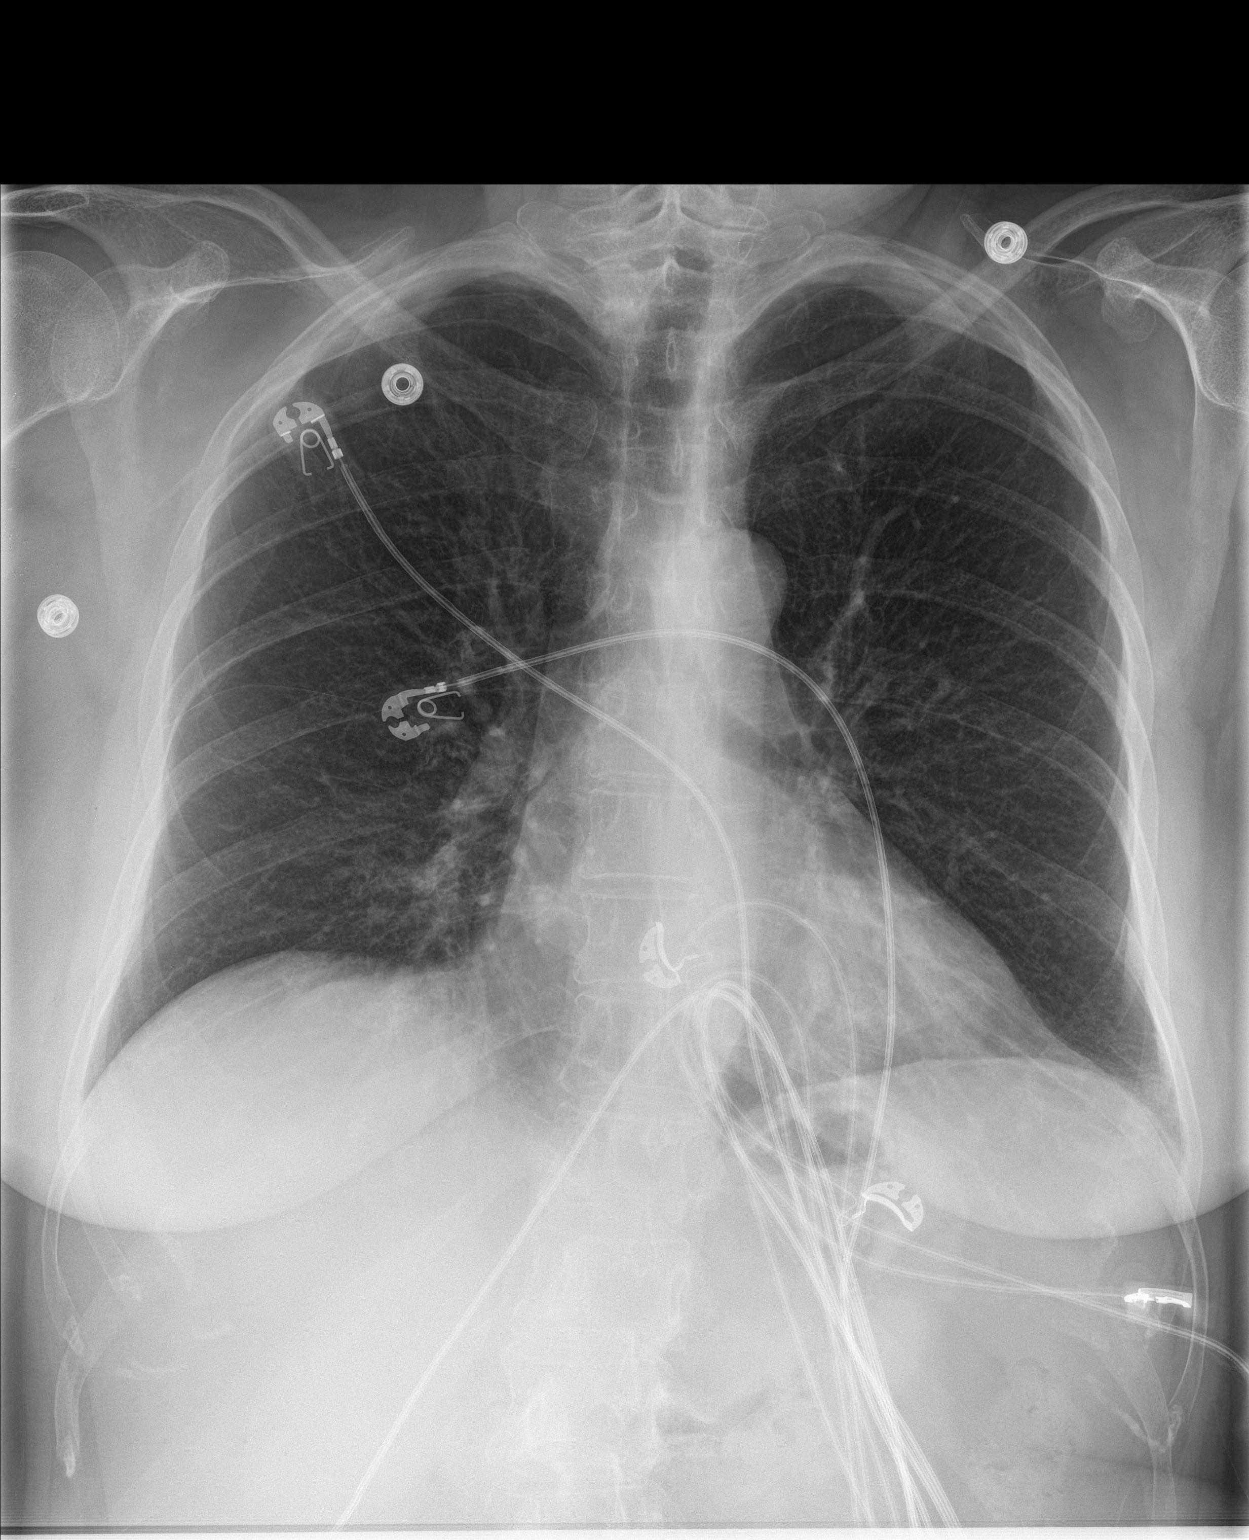

[chest lat]
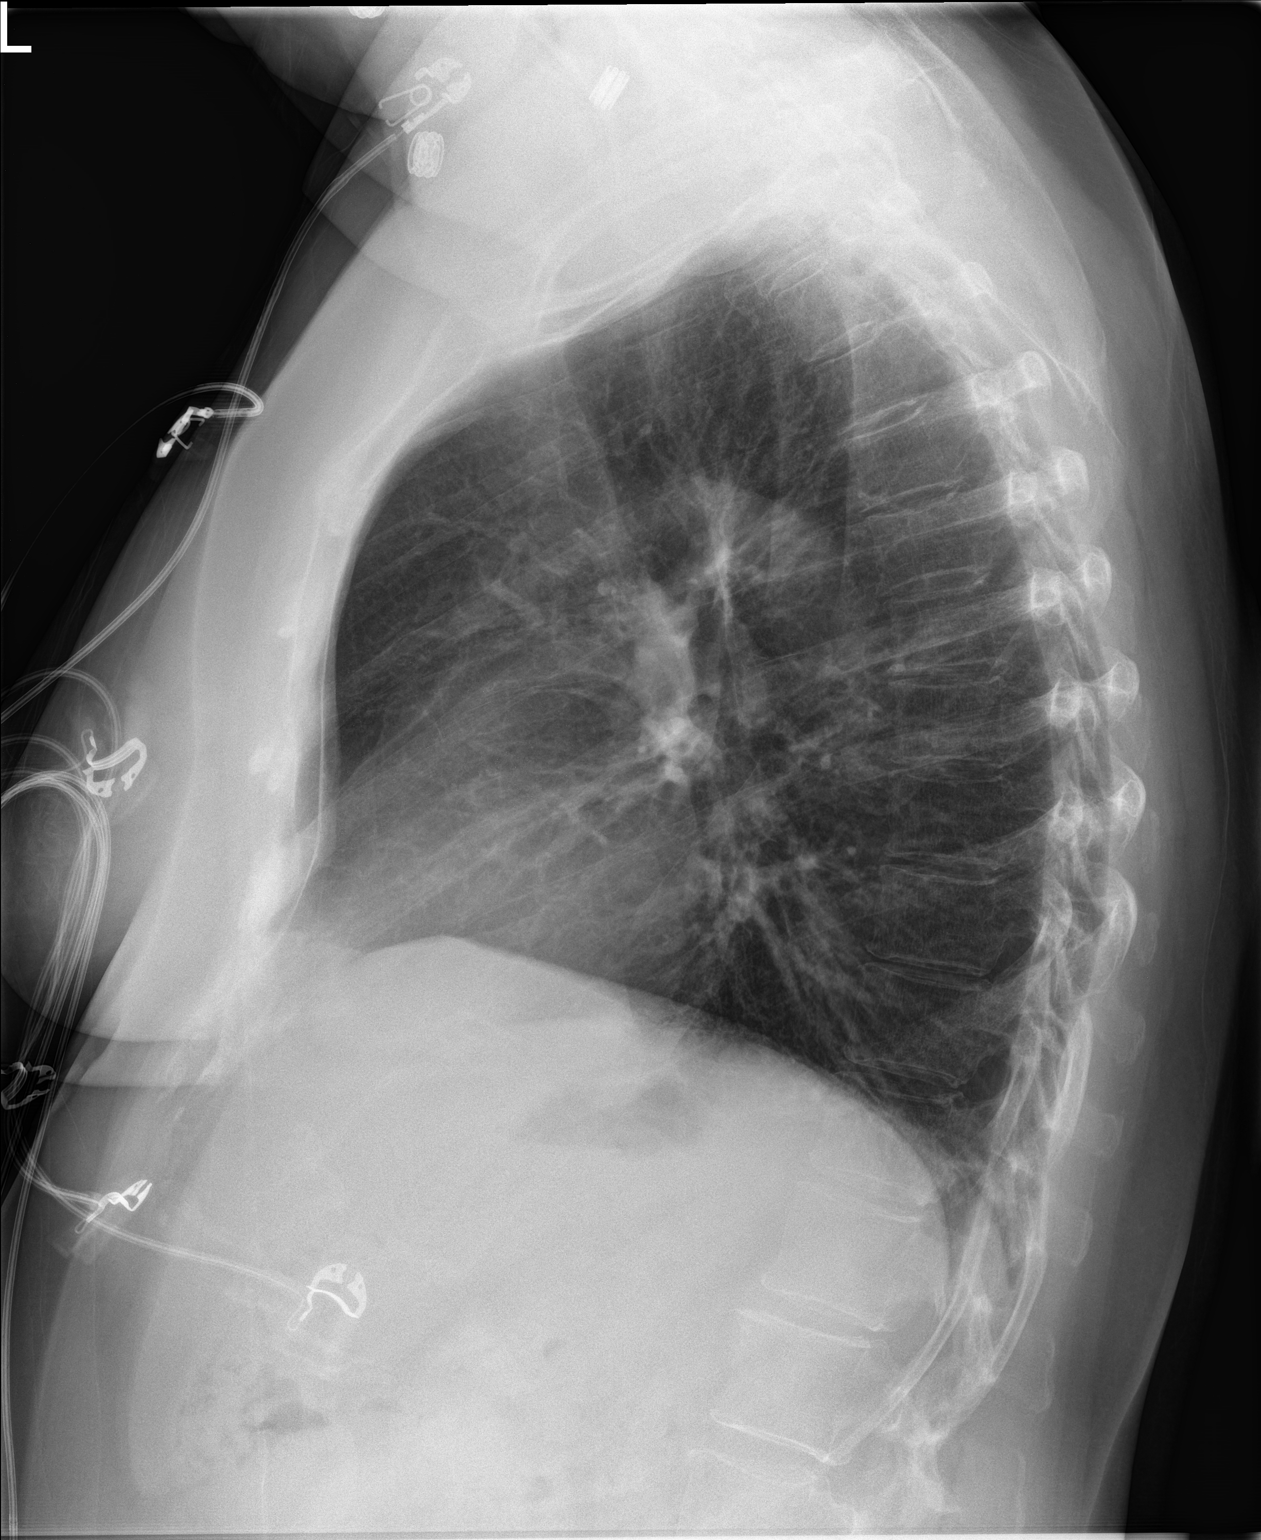

[2 of 2 positions shown; findings below may reference images not displayed]

FINDINGS: The heart size and mediastinal contours are within normal limits.
Both lungs are clear. The visualized skeletal structures are
unremarkable.
IMPRESSION: No active cardiopulmonary disease.

## 2021-07-27 ENCOUNTER — Other Ambulatory Visit: Payer: Self-pay

## 2021-07-27 ENCOUNTER — Ambulatory Visit (INDEPENDENT_AMBULATORY_CARE_PROVIDER_SITE_OTHER): Payer: Commercial Managed Care - PPO | Admitting: Internal Medicine

## 2021-07-27 ENCOUNTER — Encounter: Payer: Self-pay | Admitting: Internal Medicine

## 2021-07-27 DIAGNOSIS — I10 Essential (primary) hypertension: Secondary | ICD-10-CM | POA: Diagnosis not present

## 2021-07-27 DIAGNOSIS — R059 Cough, unspecified: Secondary | ICD-10-CM | POA: Diagnosis not present

## 2021-07-27 MED ORDER — BISOPROLOL FUMARATE 5 MG PO TABS: 5.0000 mg | ORAL_TABLET | Freq: Every day | ORAL | 11 refills | Status: AC

## 2021-07-27 MED ORDER — PREDNISONE 10 MG PO TABS: ORAL_TABLET | ORAL | 0 refills | Status: DC

## 2021-07-27 MED ORDER — FAMOTIDINE 20 MG PO TABS: ORAL_TABLET | ORAL | 11 refills | Status: AC

## 2021-07-27 MED ORDER — PANTOPRAZOLE SODIUM 40 MG PO TBEC: 40.0000 mg | DELAYED_RELEASE_TABLET | Freq: Every day | ORAL | 2 refills | Status: DC

## 2021-07-27 NOTE — Progress Notes (Signed)
Samantha Mcconnell, female    DOB: [birth year]   MRN: [id number]   Brief patient profile:  6  yowf never smoker  [employer] in [place] referred to pulmonary clinic in [place]  07/27/2021 by Bernardo Heater NP  for persistent daily cough around 2015  eval by ENT neg eval, [facility] dx occupational asthma > started symbicort but no change > ent eval was repeated and found bad sinus dz > surgery helped a lot for about a year but never completely resolved then worse again with PCP dx with allergies by skin testing > Pos to everything" but no better x when on prednisone.     History of Present Illness  07/27/2021  Pulmonary/ 1st office eval/ [facility] / [facility]  Chief Complaint  Patient presents with   Consult    Hx of chronic cough for 7 years. Has had episode of SOB that required emergency care 3 weeks ago. States she has checked O2 sats and were in 80s before.   Dyspnea:  usually when severe cough / limited by "L leg screws " Cough: to point of vomit / thick white  avg late pm before supper / wakes up once a week Sleep: bed if flat , 3 pillows  SABA use: helps the breathing but not the cough (mdi near 0% technique)  No obvious day to day or daytime variability or assoc  purulent sputum or mucus plugs or hemoptysis or cp or chest tightness, subjective wheeze or overt sinus or hb symptoms.    . Also denies any obvious fluctuation of symptoms with weather or environmental changes or other aggravating or alleviating factors except as outlined above   No unusual exposure hx or h/o childhood pna/ asthma or knowledge of premature birth.  Current Allergies, Complete Past Medical History, Past Surgical History, Family History, and Social History were reviewed in [employer] record.  ROS  The following are not active complaints unless bolded Hoarseness, sore throat, dysphagia, dental problems, itching, sneezing,  nasal congestion or discharge of excess mucus or  purulent secretions, ear ache,   fever, chills, sweats, unintended wt loss or wt gain, classically pleuritic or exertional cp,  orthopnea pnd or arm/hand swelling  or leg swelling, presyncope, palpitations, abdominal pain, anorexia, nausea, vomiting, diarrhea  or change in bowel habits or change in bladder habits, change in stools or change in urine, dysuria, hematuria,  rash, arthralgias, visual complaints, headache, numbness, weakness or ataxia or problems with walking or coordination,  change in mood or  memory.             Past Medical History:  Diagnosis Date   Dyslipidemia    High triglycerides    Hypertension     Outpatient Medications Prior to Visit  Medication Sig Dispense Refill   ALBUTEROL SULFATE IN Inhale into the lungs.     atenolol (TENORMIN) 50 MG tablet Take 50 mg by mouth daily.  11   famotidine (PEPCID) 20 mg Inject 20 mg into the vein every 12 (twelve) hours.     fenofibrate (TRICOR) 145 MG tablet Take 145 mg by mouth daily.     HYDROcodone-homatropine (HYCODAN) 5-1.5 MG/5ML syrup Take 5 mLs by mouth every 6 (six) hours as needed for cough.     levocetirizine (XYZAL) 5 MG tablet Take 5 mg by mouth every evening.     pravastatin (PRAVACHOL) 40 MG tablet Take 40 mg by mouth at bedtime.     triamterene-hydrochlorothiazide (DYAZIDE) 37.5-25 MG capsule  Take 1 capsule by mouth daily.     budesonide-formoterol (SYMBICORT) 160-4.5 MCG/ACT inhaler Inhale 2 puffs into the lungs 2 (two) times daily.     Calcium-Phosphorus-Vitamin D 026-378-588 MG-MG-UNIT CHEW Chew by mouth.     diphenhydrAMINE (BENADRYL) 25 MG tablet Take 50 mg by mouth daily.     gabapentin (NEURONTIN) 100 MG capsule Take 100 mg by mouth 3 (three) times daily.     ibuprofen (ADVIL,MOTRIN) 800 MG tablet Take 1 tablet (800 mg total) by mouth every 8 (eight) hours as needed. 90 tablet 2   montelukast (SINGULAIR) 10 MG tablet Take 10 mg by mouth at bedtime.     omeprazole (PRILOSEC) 20 MG capsule Take 20 mg by  mouth daily.     pantoprazole (PROTONIX) 40 MG tablet Take 40 mg by mouth 2 (two) times daily.     predniSONE (DELTASONE) 50 MG tablet Take 1 tablet (50 mg total) by mouth daily. 5 tablet 0   pseudoephedrine-dextromethorphan-guaifenesin (ROBITUSSIN-PE) 30-10-100 MG/5ML solution Take 10 mLs by mouth 4 (four) times daily as needed for cough.     triamterene-hydrochlorothiazide (MAXZIDE-25) 37.5-25 MG per tablet Take 1 tablet by mouth daily.  11   No facility-administered medications prior to visit.     Objective:     BP 124/80    Pulse 64    Temp 98.4 F (36.9 C)    Ht 5\' 6"  (1.676 m)    Wt 179 lb 1.3 oz (81.2 kg)    SpO2 97% Comment: ra   BMI 28.90 kg/m   SpO2: 97 % (ra)  Pleasant amb wf, no cough or throat clearing during eval    HEENT : pt wearing mask not removed for exam due to covid -19 concerns.    NECK :  without JVD/Nodes/TM/ nl carotid upstrokes bilaterally   LUNGS: no acc muscle use,  Nl contour chest which is clear to A and P bilaterally without cough on insp or exp maneuvers   CV:  RRR  no s3 or murmur or increase in P2, and no edema   ABD:  soft and nontender with nl inspiratory excursion in the supine position. No bruits or organomegaly appreciated, bowel sounds nl  MS:  Nl gait/ ext warm without deformities, calf tenderness, cyanosis or clubbing No obvious joint restrictions   SKIN: warm and dry without lesions    NEURO:  alert, approp, nl sensorium with  no motor or cerebellar deficits apparent.    I personally reviewed  radiology  impression as follows:  CXR:   06/11/21 Wnl      Assessment   Cough Onset 2015 - [facility] but relapsed w/in a year - sinus CT 04/11/17  Mild pansinus mucosal thickening, significantly improved from prior exam  PCP dx with allergies by skin testing > Pos to everything" but no better x when on prednisone.  - trial off tenormmin 07/27/2021 >>>  - Allergy profile 07/27/2021 >  Eos 0. /  IgE    - referred to allergy 07/27/2021 >>>  - The proper method of use, as well as anticipated side effects, of a metered-dose inhaler were discussed and demonstrated to the patient using teach back method. Improved effectiveness after extensive coaching during this visit to a level of approximately 0% from a baseline of 0 % so not likely saba  Or any hfa would help her here.  ddx is chronic rhinitis pnds vs cough variant asthma and I favor the former but  if she has asthma needs a trial off tenormin (relatively non-selective Beta blocker)  before adding back empirical B agonists in any form esp since she struggles with hfa technique to point where not really effective (and DPI's likely just to make her cough worse) .  In meantime strongly suspect Upper airway cough syndrome (previously labeled PNDS),  is so named because it's frequently impossible to sort out how much is  CR/sinusitis with freq throat clearing (which can be related to primary GERD)   vs  causing  secondary (" extra esophageal")  GERD from wide swings in gastric pressure that occur with throat clearing, often  promoting self use of mint and menthol lozenges that reduce the lower esophageal sphincter tone and exacerbate the problem further in a cyclical fashion.   These are the same pts (now being labeled as having "irritable larynx syndrome" by some cough centers) who not infrequently have a history of having failed to tolerate ace inhibitors,  dry powder inhalers or biphosphonates or report having atypical/extraesophageal reflux symptoms that don't respond to standard doses of PPI  and are easily confused as having aecopd or asthma flares by even experienced allergists/ pulmonologists (myself included).   rec Repeat short term prednisone  Max rx for gerd  1st gen H1 blockers per guidelines   Allergy eval ? Candidate for dupixent or biologic? Pulmonary f/u is prn       Essential hypertension Try off atenolol 07/27/2021  > change to 5  mg bisoprolol  In the setting of respiratory symptoms of unknown etiology,  It would be preferable to use bystolic, the most beta -1  selective Beta blocker available in sample form, with bisoprolol the most selective generic choice  on the market, at least on a trial basis, to make sure the spillover Beta 2 effects of the less specific Beta blockers are not contributing to this patient's symptoms.   >>>try bisoprolol 5 mg daily and f/u PCP   Each maintenance medication was reviewed in detail including emphasizing most importantly the difference between maintenance and prns and under what circumstances the prns are to be triggered using an action plan format where appropriate.  Total time for H and P, chart review, counseling, reviewing hfa  device(s) and generating customized AVS unique to this office visit / same day charting > 45 min         Christinia Gully, MD 07/27/2021

## 2021-07-27 NOTE — Assessment & Plan Note (Signed)
Try off atenolol 07/27/2021  > change to 5 mg bisoprolol  In the setting of respiratory symptoms of unknown etiology,  It would be preferable to use bystolic, the most beta -1  selective Beta blocker available in sample form, with bisoprolol the most selective generic choice  on the market, at least on a trial basis, to make sure the spillover Beta 2 effects of the less specific Beta blockers are not contributing to this patient's symptoms.   >>>try bisoprolol 5 mg daily and f/u PCP

## 2021-07-27 NOTE — Patient Instructions (Addendum)
Pantoprazole (protonix) 40 mg   Take  30-60 min before first meal of the day and Pepcid (famotidine)  20 mg after supper until return to office - this is the best way to tell whether stomach acid is contributing to your problem.     GERD (REFLUX)  is an extremely common cause of respiratory symptoms just like yours , many times with no obvious heartburn at all.    It can be treated with medication, but also with lifestyle changes including elevation of the head of your bed (ideally with 6 -8inch blocks under the headboard of your bed),  Smoking cessation, avoidance of late meals, excessive alcohol, and avoid fatty foods, chocolate, peppermint, colas, red wine, and acidic juices such as orange juice.  NO MINT OR MENTHOL PRODUCTS SO NO COUGH DROPS  USE SUGARLESS CANDY INSTEAD (Jolley ranchers or Stover's or Life Savers) or even ice chips will also do - the key is to swallow to prevent all throat clearing. NO OIL BASED VITAMINS - use powdered substitutes.  Avoid fish oil when coughing.    For drainage / throat tickle try take CHLORPHENIRAMINE  4 mg    take one every 4 hours as needed - available over the counter- may cause drowsiness so start with just a dose or two an hour before bedtime and see how you tolerate it before trying in daytime     Prednisone 10 mg take  4 each am x 2 days,   2 each am x 2 days,  1 each am x 2 days and stop   Stop tenormin and bisoprol 5 mg one daily in its place   I will be referring you to Allergy in [place]

## 2021-07-27 NOTE — Assessment & Plan Note (Addendum)
Onset 2015 - [facility] but relapsed w/in a year - sinus CT 04/11/17  Mild pansinus mucosal thickening, significantly improved from prior exam  PCP dx with allergies by skin testing > Pos to everything" but no better x when on prednisone.  - trial off tenormmin 07/27/2021 >>>  - Allergy profile 07/27/2021 >  Eos 0. /  IgE   - referred to allergy 07/27/2021 >>>  - The proper method of use, as well as anticipated side effects, of a metered-dose inhaler were discussed and demonstrated to the patient using teach back method. Improved effectiveness after extensive coaching during this visit to a level of approximately 0% from a baseline of 0 % so not likely saba  Or any hfa would help her here.  ddx is chronic rhinitis pnds vs cough variant asthma and I favor the former but if she has asthma needs a trial off tenormin (relatively non-selective Beta blocker)  before adding back empirical B agonists in any form esp since she struggles with hfa technique to point where not really effective (and DPI's likely just to make her cough worse) .  In meantime strongly suspect Upper airway cough syndrome (previously labeled PNDS),  is so named because it's frequently impossible to sort out how much is  CR/sinusitis with freq throat clearing (which can be related to primary GERD)   vs  causing  secondary (" extra esophageal")  GERD from wide swings in gastric pressure that occur with throat clearing, often  promoting self use of mint and menthol lozenges that reduce the lower esophageal sphincter tone and exacerbate the problem further in a cyclical fashion.   These are the same pts (now being labeled as having "irritable larynx syndrome" by some cough centers) who not infrequently have a history of having failed to tolerate ace inhibitors,  dry powder inhalers or biphosphonates or report having atypical/extraesophageal reflux symptoms that don't respond to standard doses of PPI  and are  easily confused as having aecopd or asthma flares by even experienced allergists/ pulmonologists (myself included).   rec Repeat short term prednisone  Max rx for gerd  1st gen H1 blockers per guidelines   Allergy eval ? Candidate for dupixent or biologic?  Pulmonary f/u is prn          Each maintenance medication was reviewed in detail including emphasizing most importantly the difference between maintenance and prns and under what circumstances the prns are to be triggered using an action plan format where appropriate.  Total time for H and P, chart review, counseling, reviewing hfa  device(s) and generating customized AVS unique to this office visit / same day charting > 45 min

## 2021-07-31 LAB — CBC WITH DIFFERENTIAL/PLATELET
Basophils Absolute: 0.2 10*3/uL (ref 0.0–0.2)
Basos: 1 %
EOS (ABSOLUTE): 0.8 10*3/uL — ABNORMAL HIGH (ref 0.0–0.4)
Eos: 7 %
Hematocrit: 42.4 % (ref 34.0–46.6)
Hemoglobin: 14.1 g/dL (ref 11.1–15.9)
Immature Grans (Abs): 0 10*3/uL (ref 0.0–0.1)
Immature Granulocytes: 0 %
Lymphocytes Absolute: 3.1 10*3/uL (ref 0.7–3.1)
Lymphs: 27 %
MCH: 33.1 pg — ABNORMAL HIGH (ref 26.6–33.0)
MCHC: 33.3 g/dL (ref 31.5–35.7)
MCV: 100 fL — ABNORMAL HIGH (ref 79–97)
Monocytes Absolute: 0.9 10*3/uL (ref 0.1–0.9)
Monocytes: 8 %
Neutrophils Absolute: 6.4 10*3/uL (ref 1.4–7.0)
Neutrophils: 57 %
Platelets: 365 10*3/uL (ref 150–450)
RBC: 4.26 x10E6/uL (ref 3.77–5.28)
RDW: 11.8 % (ref 11.7–15.4)
WBC: 11.4 10*3/uL — ABNORMAL HIGH (ref 3.4–10.8)

## 2021-07-31 LAB — IGE: IgE (Immunoglobulin E), Serum: 92 IU/mL (ref 6–495)

## 2021-08-29 ENCOUNTER — Telehealth: Payer: Self-pay | Admitting: Internal Medicine

## 2021-08-29 DIAGNOSIS — R059 Cough, unspecified: Secondary | ICD-10-CM

## 2021-08-29 MED ORDER — PREDNISONE 10 MG PO TABS: ORAL_TABLET | ORAL | 0 refills | Status: AC

## 2021-08-29 MED ORDER — AZITHROMYCIN 250 MG PO TABS: 250.0000 mg | ORAL_TABLET | Freq: Every day | ORAL | 0 refills | Status: AC

## 2021-08-29 NOTE — Telephone Encounter (Signed)
Primary Pulmonologist: Dr. Melvyn Novas  Last office visit and with whom: Dr. Melvyn Novas 07/27/2021 What do we see them for (pulmonary problems): Cough  Last OV assessment/plan: see below   Was appointment offered to patient (explain)?  No none available    Reason for call: Has a cough that has a lot of pheglm and mucous. Using inhaler frequently and is SOB. Would like prednisone called in to her pharmacy. Pharmacy is [facility].  ATC patient to discuss. No answer LMTCB   Dr. Melvyn Novas please advise      No Known Allergies  Immunization History  Administered Date(s) Administered   Influenza-Unspecified 04/12/2017   Moderna Covid-19 Vaccine Bivalent Booster 86yrs & up 06/23/2021   Moderna SARS-COV2 Booster Vaccination 06/17/2020   Moderna Sars-Covid-2 Vaccination 08/19/2019, 09/16/2019     Assessment    Cough Onset 2015 - better p sinus surgery at [facility] a[facility] 2017 but relapsed w/in a year - sinus CT 04/11/17  Mild pansinus mucosal thickening, significantly improved from prior exam  PCP dx with allergies by skin testing > Pos to everything" but no better x when on prednisone.  - trial off tenormmin 07/27/2021 >>>  - Allergy profile 07/27/2021 >  Eos 0. /  IgE   - referred to allergy 07/27/2021 >>>   - The proper method of use, as well as anticipated side effects, of a metered-dose inhaler were discussed and demonstrated to the patient using teach back method. Improved effectiveness after extensive coaching during this visit to a level of approximately 0% from a baseline of 0 % so not likely saba  Or any hfa would help her here.   ddx is chronic rhinitis pnds vs cough variant asthma and I favor the former but if she has asthma needs a trial off tenormin (relatively non-selective Beta blocker)  before adding back empirical B agonists in any form esp since she struggles with hfa technique to point where not really effective (and DPI's likely just to make her cough worse) .   In meantime strongly suspect  Upper airway cough syndrome (previously labeled PNDS),  is so named because it's frequently impossible to sort out how much is  CR/sinusitis with freq throat clearing (which can be related to primary GERD)   vs  causing  secondary (" extra esophageal")  GERD from wide swings in gastric pressure that occur with throat clearing, often  promoting self use of mint and menthol lozenges that reduce the lower esophageal sphincter tone and exacerbate the problem further in a cyclical fashion.    These are the same pts (now being labeled as having "irritable larynx syndrome" by some cough centers) who not infrequently have a history of having failed to tolerate ace inhibitors,  dry powder inhalers or biphosphonates or report having atypical/extraesophageal reflux symptoms that don't respond to standard doses of PPI  and are easily confused as having aecopd or asthma flares by even experienced allergists/ pulmonologists (myself included).     rec Repeat short term prednisone  Max rx for gerd  1st gen H1 blockers per guidelines   Allergy eval ? Candidate for dupixent or biologic? Pulmonary f/u is prn         Essential hypertension Try off atenolol 07/27/2021  > change to 5 mg bisoprolol   In the setting of respiratory symptoms of unknown etiology,  It would be preferable to use bystolic, the most beta -1  selective Beta blocker available in sample form, with bisoprolol the most selective generic choice  on  the market, at least on a trial basis, to make sure the spillover Beta 2 effects of the less specific Beta blockers are not contributing to this patient's symptoms.    >>>try bisoprolol 5 mg daily and f/u PCP     Each maintenance medication was reviewed in detail including emphasizing most importantly the difference between maintenance and prns and under what circumstances the prns are to be triggered using an action plan format where appropriate.   Total time for H and P, chart review, counseling,  reviewing hfa  device(s) and generating customized AVS unique to this office visit / same day charting > 45 min

## 2021-08-29 NOTE — Telephone Encounter (Signed)
Called and left a detailed message per DPR for patient that prescription was sent in to pharmacy on file. Two prescriptions were sent to pharmacy and that if she had any questions to call office. Nothing further needed at this time

## 2021-08-29 NOTE — Telephone Encounter (Signed)
Zpak/ Prednisone 10 mg take  4 each am x 2 days,   2 each am x 2 days,  1 each am x 2 days and stop  

## 2021-09-14 ENCOUNTER — Ambulatory Visit: Payer: Self-pay | Admitting: Allergy & Immunology

## 2021-09-17 ENCOUNTER — Ambulatory Visit: Payer: Self-pay | Admitting: Allergy & Immunology

## 2021-10-11 DIAGNOSIS — M25572 Pain in left ankle and joints of left foot: Secondary | ICD-10-CM | POA: Diagnosis not present

## 2021-10-11 DIAGNOSIS — M25472 Effusion, left ankle: Secondary | ICD-10-CM | POA: Diagnosis not present

## 2021-10-11 DIAGNOSIS — M19079 Primary osteoarthritis, unspecified ankle and foot: Secondary | ICD-10-CM | POA: Diagnosis not present

## 2021-11-05 ENCOUNTER — Other Ambulatory Visit: Payer: Self-pay | Admitting: Internal Medicine

## 2021-11-05 DIAGNOSIS — R059 Cough, unspecified: Secondary | ICD-10-CM

## 2021-11-19 DIAGNOSIS — M79605 Pain in left leg: Secondary | ICD-10-CM | POA: Diagnosis not present

## 2021-11-19 DIAGNOSIS — I10 Essential (primary) hypertension: Secondary | ICD-10-CM | POA: Diagnosis not present

## 2021-11-19 DIAGNOSIS — R69 Illness, unspecified: Secondary | ICD-10-CM | POA: Diagnosis not present

## 2021-11-19 DIAGNOSIS — Z0000 Encounter for general adult medical examination without abnormal findings: Secondary | ICD-10-CM | POA: Diagnosis not present

## 2021-11-19 DIAGNOSIS — Z6830 Body mass index (BMI) 30.0-30.9, adult: Secondary | ICD-10-CM | POA: Diagnosis not present

## 2021-11-19 DIAGNOSIS — Z789 Other specified health status: Secondary | ICD-10-CM | POA: Diagnosis not present

## 2021-11-19 DIAGNOSIS — Z7189 Other specified counseling: Secondary | ICD-10-CM | POA: Diagnosis not present

## 2021-11-19 DIAGNOSIS — Z1339 Encounter for screening examination for other mental health and behavioral disorders: Secondary | ICD-10-CM | POA: Diagnosis not present

## 2021-11-19 DIAGNOSIS — Z1331 Encounter for screening for depression: Secondary | ICD-10-CM | POA: Diagnosis not present

## 2021-11-19 DIAGNOSIS — J441 Chronic obstructive pulmonary disease with (acute) exacerbation: Secondary | ICD-10-CM | POA: Diagnosis not present

## 2021-11-19 DIAGNOSIS — Z299 Encounter for prophylactic measures, unspecified: Secondary | ICD-10-CM | POA: Diagnosis not present

## 2021-11-20 DIAGNOSIS — E7800 Pure hypercholesterolemia, unspecified: Secondary | ICD-10-CM | POA: Diagnosis not present

## 2021-11-20 DIAGNOSIS — Z79899 Other long term (current) drug therapy: Secondary | ICD-10-CM | POA: Diagnosis not present

## 2021-11-20 DIAGNOSIS — F419 Anxiety disorder, unspecified: Secondary | ICD-10-CM | POA: Diagnosis not present

## 2021-11-20 DIAGNOSIS — R69 Illness, unspecified: Secondary | ICD-10-CM | POA: Diagnosis not present

## 2021-12-25 DIAGNOSIS — J329 Chronic sinusitis, unspecified: Secondary | ICD-10-CM | POA: Diagnosis not present

## 2021-12-25 DIAGNOSIS — I10 Essential (primary) hypertension: Secondary | ICD-10-CM | POA: Diagnosis not present

## 2021-12-25 DIAGNOSIS — Z299 Encounter for prophylactic measures, unspecified: Secondary | ICD-10-CM | POA: Diagnosis not present

## 2022-03-04 DIAGNOSIS — Z299 Encounter for prophylactic measures, unspecified: Secondary | ICD-10-CM | POA: Diagnosis not present

## 2022-03-04 DIAGNOSIS — Z789 Other specified health status: Secondary | ICD-10-CM | POA: Diagnosis not present

## 2022-03-04 DIAGNOSIS — R42 Dizziness and giddiness: Secondary | ICD-10-CM | POA: Diagnosis not present

## 2022-03-04 DIAGNOSIS — I10 Essential (primary) hypertension: Secondary | ICD-10-CM | POA: Diagnosis not present

## 2022-03-04 DIAGNOSIS — R053 Chronic cough: Secondary | ICD-10-CM | POA: Diagnosis not present

## 2022-03-04 DIAGNOSIS — R350 Frequency of micturition: Secondary | ICD-10-CM | POA: Diagnosis not present

## 2022-04-29 DIAGNOSIS — R509 Fever, unspecified: Secondary | ICD-10-CM | POA: Diagnosis not present

## 2022-04-29 DIAGNOSIS — U071 COVID-19: Secondary | ICD-10-CM | POA: Diagnosis not present

## 2022-04-29 DIAGNOSIS — R059 Cough, unspecified: Secondary | ICD-10-CM | POA: Diagnosis not present

## 2022-06-07 DIAGNOSIS — J441 Chronic obstructive pulmonary disease with (acute) exacerbation: Secondary | ICD-10-CM | POA: Diagnosis not present

## 2022-06-07 DIAGNOSIS — Z299 Encounter for prophylactic measures, unspecified: Secondary | ICD-10-CM | POA: Diagnosis not present

## 2022-06-07 DIAGNOSIS — I10 Essential (primary) hypertension: Secondary | ICD-10-CM | POA: Diagnosis not present

## 2022-07-08 DIAGNOSIS — E785 Hyperlipidemia, unspecified: Secondary | ICD-10-CM | POA: Diagnosis not present

## 2022-07-08 DIAGNOSIS — K219 Gastro-esophageal reflux disease without esophagitis: Secondary | ICD-10-CM | POA: Diagnosis not present

## 2022-07-08 DIAGNOSIS — K635 Polyp of colon: Secondary | ICD-10-CM | POA: Diagnosis not present

## 2022-07-08 DIAGNOSIS — K621 Rectal polyp: Secondary | ICD-10-CM | POA: Diagnosis not present

## 2022-07-08 DIAGNOSIS — D120 Benign neoplasm of cecum: Secondary | ICD-10-CM | POA: Diagnosis not present

## 2022-07-08 DIAGNOSIS — I10 Essential (primary) hypertension: Secondary | ICD-10-CM | POA: Diagnosis not present

## 2022-07-08 DIAGNOSIS — D122 Benign neoplasm of ascending colon: Secondary | ICD-10-CM | POA: Diagnosis not present

## 2022-07-08 DIAGNOSIS — K6289 Other specified diseases of anus and rectum: Secondary | ICD-10-CM | POA: Diagnosis not present

## 2022-07-08 DIAGNOSIS — Z1211 Encounter for screening for malignant neoplasm of colon: Secondary | ICD-10-CM | POA: Diagnosis not present

## 2022-07-08 DIAGNOSIS — Z79899 Other long term (current) drug therapy: Secondary | ICD-10-CM | POA: Diagnosis not present

## 2022-07-08 DIAGNOSIS — D123 Benign neoplasm of transverse colon: Secondary | ICD-10-CM | POA: Diagnosis not present

## 2022-07-08 DIAGNOSIS — D125 Benign neoplasm of sigmoid colon: Secondary | ICD-10-CM | POA: Diagnosis not present

## 2022-07-08 DIAGNOSIS — Z86010 Personal history of colonic polyps: Secondary | ICD-10-CM | POA: Diagnosis not present

## 2022-07-08 DIAGNOSIS — K640 First degree hemorrhoids: Secondary | ICD-10-CM | POA: Diagnosis not present

## 2022-07-30 DIAGNOSIS — J441 Chronic obstructive pulmonary disease with (acute) exacerbation: Secondary | ICD-10-CM | POA: Diagnosis not present

## 2022-07-30 DIAGNOSIS — Z299 Encounter for prophylactic measures, unspecified: Secondary | ICD-10-CM | POA: Diagnosis not present

## 2022-07-30 DIAGNOSIS — I10 Essential (primary) hypertension: Secondary | ICD-10-CM | POA: Diagnosis not present

## 2022-07-30 DIAGNOSIS — R6889 Other general symptoms and signs: Secondary | ICD-10-CM | POA: Diagnosis not present

## 2022-11-07 DIAGNOSIS — R0981 Nasal congestion: Secondary | ICD-10-CM | POA: Diagnosis not present

## 2022-11-07 DIAGNOSIS — J45901 Unspecified asthma with (acute) exacerbation: Secondary | ICD-10-CM | POA: Diagnosis not present

## 2022-11-22 DIAGNOSIS — R5383 Other fatigue: Secondary | ICD-10-CM | POA: Diagnosis not present

## 2022-11-22 DIAGNOSIS — Z1331 Encounter for screening for depression: Secondary | ICD-10-CM | POA: Diagnosis not present

## 2022-11-22 DIAGNOSIS — I10 Essential (primary) hypertension: Secondary | ICD-10-CM | POA: Diagnosis not present

## 2022-11-22 DIAGNOSIS — Z7189 Other specified counseling: Secondary | ICD-10-CM | POA: Diagnosis not present

## 2022-11-22 DIAGNOSIS — Z0000 Encounter for general adult medical examination without abnormal findings: Secondary | ICD-10-CM | POA: Diagnosis not present

## 2022-11-22 DIAGNOSIS — Z79899 Other long term (current) drug therapy: Secondary | ICD-10-CM | POA: Diagnosis not present

## 2022-11-22 DIAGNOSIS — Z1339 Encounter for screening examination for other mental health and behavioral disorders: Secondary | ICD-10-CM | POA: Diagnosis not present

## 2022-11-22 DIAGNOSIS — E7800 Pure hypercholesterolemia, unspecified: Secondary | ICD-10-CM | POA: Diagnosis not present

## 2022-11-22 DIAGNOSIS — Z299 Encounter for prophylactic measures, unspecified: Secondary | ICD-10-CM | POA: Diagnosis not present

## 2023-02-03 DIAGNOSIS — Z1231 Encounter for screening mammogram for malignant neoplasm of breast: Secondary | ICD-10-CM | POA: Diagnosis not present

## 2023-03-03 DIAGNOSIS — N951 Menopausal and female climacteric states: Secondary | ICD-10-CM | POA: Diagnosis not present

## 2023-03-03 DIAGNOSIS — M19072 Primary osteoarthritis, left ankle and foot: Secondary | ICD-10-CM | POA: Diagnosis not present

## 2023-03-03 DIAGNOSIS — M2540 Effusion, unspecified joint: Secondary | ICD-10-CM | POA: Diagnosis not present

## 2023-03-03 DIAGNOSIS — I10 Essential (primary) hypertension: Secondary | ICD-10-CM | POA: Diagnosis not present

## 2023-03-03 DIAGNOSIS — T8484XA Pain due to internal orthopedic prosthetic devices, implants and grafts, initial encounter: Secondary | ICD-10-CM | POA: Diagnosis not present

## 2023-03-03 DIAGNOSIS — Z299 Encounter for prophylactic measures, unspecified: Secondary | ICD-10-CM | POA: Diagnosis not present

## 2023-03-14 DIAGNOSIS — Z8781 Personal history of (healed) traumatic fracture: Secondary | ICD-10-CM | POA: Diagnosis not present

## 2023-03-14 DIAGNOSIS — S82302D Unspecified fracture of lower end of left tibia, subsequent encounter for closed fracture with routine healing: Secondary | ICD-10-CM | POA: Diagnosis not present

## 2023-03-14 DIAGNOSIS — S82402D Unspecified fracture of shaft of left fibula, subsequent encounter for closed fracture with routine healing: Secondary | ICD-10-CM | POA: Diagnosis not present

## 2023-03-14 DIAGNOSIS — M12572 Traumatic arthropathy, left ankle and foot: Secondary | ICD-10-CM | POA: Diagnosis not present

## 2023-03-14 DIAGNOSIS — M25472 Effusion, left ankle: Secondary | ICD-10-CM | POA: Diagnosis not present

## 2023-03-14 DIAGNOSIS — Z967 Presence of other bone and tendon implants: Secondary | ICD-10-CM | POA: Diagnosis not present

## 2023-03-14 DIAGNOSIS — T8484XA Pain due to internal orthopedic prosthetic devices, implants and grafts, initial encounter: Secondary | ICD-10-CM | POA: Diagnosis not present

## 2023-03-14 DIAGNOSIS — M8580 Other specified disorders of bone density and structure, unspecified site: Secondary | ICD-10-CM | POA: Diagnosis not present

## 2023-03-14 DIAGNOSIS — M25572 Pain in left ankle and joints of left foot: Secondary | ICD-10-CM | POA: Diagnosis not present

## 2023-03-14 DIAGNOSIS — M19072 Primary osteoarthritis, left ankle and foot: Secondary | ICD-10-CM | POA: Diagnosis not present

## 2023-03-14 DIAGNOSIS — Z9889 Other specified postprocedural states: Secondary | ICD-10-CM | POA: Diagnosis not present

## 2023-03-14 DIAGNOSIS — Y831 Surgical operation with implant of artificial internal device as the cause of abnormal reaction of the patient, or of later complication, without mention of misadventure at the time of the procedure: Secondary | ICD-10-CM | POA: Diagnosis not present

## 2023-03-17 DIAGNOSIS — M12572 Traumatic arthropathy, left ankle and foot: Secondary | ICD-10-CM | POA: Diagnosis not present

## 2023-03-17 DIAGNOSIS — E2839 Other primary ovarian failure: Secondary | ICD-10-CM | POA: Diagnosis not present

## 2023-03-17 DIAGNOSIS — M25572 Pain in left ankle and joints of left foot: Secondary | ICD-10-CM | POA: Diagnosis not present

## 2023-03-17 DIAGNOSIS — T8484XA Pain due to internal orthopedic prosthetic devices, implants and grafts, initial encounter: Secondary | ICD-10-CM | POA: Diagnosis not present

## 2023-03-17 DIAGNOSIS — G8929 Other chronic pain: Secondary | ICD-10-CM | POA: Diagnosis not present

## 2023-03-17 DIAGNOSIS — M25472 Effusion, left ankle: Secondary | ICD-10-CM | POA: Diagnosis not present

## 2023-03-27 ENCOUNTER — Ambulatory Visit: Payer: Commercial Managed Care - PPO | Admitting: Orthopedic Surgery

## 2023-04-11 DIAGNOSIS — M19172 Post-traumatic osteoarthritis, left ankle and foot: Secondary | ICD-10-CM | POA: Diagnosis not present

## 2023-06-19 DIAGNOSIS — D485 Neoplasm of uncertain behavior of skin: Secondary | ICD-10-CM | POA: Diagnosis not present

## 2023-06-19 DIAGNOSIS — L573 Poikiloderma of Civatte: Secondary | ICD-10-CM | POA: Diagnosis not present

## 2023-06-23 DIAGNOSIS — H43811 Vitreous degeneration, right eye: Secondary | ICD-10-CM | POA: Diagnosis not present

## 2023-06-23 DIAGNOSIS — H524 Presbyopia: Secondary | ICD-10-CM | POA: Diagnosis not present

## 2023-07-08 DIAGNOSIS — C44519 Basal cell carcinoma of skin of other part of trunk: Secondary | ICD-10-CM | POA: Diagnosis not present

## 2023-07-08 DIAGNOSIS — L905 Scar conditions and fibrosis of skin: Secondary | ICD-10-CM | POA: Diagnosis not present

## 2023-12-08 DIAGNOSIS — Z8781 Personal history of (healed) traumatic fracture: Secondary | ICD-10-CM | POA: Diagnosis not present

## 2023-12-08 DIAGNOSIS — S82232S Displaced oblique fracture of shaft of left tibia, sequela: Secondary | ICD-10-CM | POA: Diagnosis not present

## 2023-12-08 DIAGNOSIS — M79662 Pain in left lower leg: Secondary | ICD-10-CM | POA: Diagnosis not present

## 2023-12-08 DIAGNOSIS — M19072 Primary osteoarthritis, left ankle and foot: Secondary | ICD-10-CM | POA: Diagnosis not present

## 2023-12-08 DIAGNOSIS — M7732 Calcaneal spur, left foot: Secondary | ICD-10-CM | POA: Diagnosis not present

## 2023-12-08 DIAGNOSIS — M25572 Pain in left ankle and joints of left foot: Secondary | ICD-10-CM | POA: Diagnosis not present

## 2023-12-08 DIAGNOSIS — G8929 Other chronic pain: Secondary | ICD-10-CM | POA: Diagnosis not present

## 2023-12-30 DIAGNOSIS — I781 Nevus, non-neoplastic: Secondary | ICD-10-CM | POA: Diagnosis not present

## 2023-12-30 DIAGNOSIS — L910 Hypertrophic scar: Secondary | ICD-10-CM | POA: Diagnosis not present

## 2023-12-30 DIAGNOSIS — Z85828 Personal history of other malignant neoplasm of skin: Secondary | ICD-10-CM | POA: Diagnosis not present

## 2024-01-06 DIAGNOSIS — R5383 Other fatigue: Secondary | ICD-10-CM | POA: Diagnosis not present

## 2024-01-06 DIAGNOSIS — Z0000 Encounter for general adult medical examination without abnormal findings: Secondary | ICD-10-CM | POA: Diagnosis not present

## 2024-01-06 DIAGNOSIS — Z79899 Other long term (current) drug therapy: Secondary | ICD-10-CM | POA: Diagnosis not present

## 2024-01-06 DIAGNOSIS — E7800 Pure hypercholesterolemia, unspecified: Secondary | ICD-10-CM | POA: Diagnosis not present

## 2024-02-03 DIAGNOSIS — M722 Plantar fascial fibromatosis: Secondary | ICD-10-CM | POA: Diagnosis not present

## 2024-02-03 DIAGNOSIS — M19072 Primary osteoarthritis, left ankle and foot: Secondary | ICD-10-CM | POA: Diagnosis not present

## 2024-02-03 DIAGNOSIS — R600 Localized edema: Secondary | ICD-10-CM | POA: Diagnosis not present

## 2024-02-03 DIAGNOSIS — M25572 Pain in left ankle and joints of left foot: Secondary | ICD-10-CM | POA: Diagnosis not present

## 2024-02-03 DIAGNOSIS — M25472 Effusion, left ankle: Secondary | ICD-10-CM | POA: Diagnosis not present

## 2024-03-08 DIAGNOSIS — M19079 Primary osteoarthritis, unspecified ankle and foot: Secondary | ICD-10-CM | POA: Diagnosis not present

## 2024-03-08 DIAGNOSIS — M19072 Primary osteoarthritis, left ankle and foot: Secondary | ICD-10-CM | POA: Diagnosis not present

## 2024-03-08 DIAGNOSIS — E7800 Pure hypercholesterolemia, unspecified: Secondary | ICD-10-CM | POA: Diagnosis not present

## 2024-06-24 DIAGNOSIS — H43812 Vitreous degeneration, left eye: Secondary | ICD-10-CM | POA: Diagnosis not present

## 2024-06-24 DIAGNOSIS — H524 Presbyopia: Secondary | ICD-10-CM | POA: Diagnosis not present

## 2024-07-01 ENCOUNTER — Ambulatory Visit: Attending: Internal Medicine | Admitting: Internal Medicine

## 2024-07-01 ENCOUNTER — Encounter: Payer: Self-pay | Admitting: Internal Medicine

## 2024-07-01 VITALS — BP 136/80 | HR 68 | Wt 200.0 lb

## 2024-07-01 DIAGNOSIS — R42 Dizziness and giddiness: Secondary | ICD-10-CM | POA: Diagnosis not present

## 2024-07-01 DIAGNOSIS — R079 Chest pain, unspecified: Secondary | ICD-10-CM | POA: Diagnosis not present

## 2024-07-01 DIAGNOSIS — R0789 Other chest pain: Secondary | ICD-10-CM | POA: Diagnosis not present

## 2024-07-01 DIAGNOSIS — E781 Pure hyperglyceridemia: Secondary | ICD-10-CM

## 2024-07-01 DIAGNOSIS — Z13220 Encounter for screening for lipoid disorders: Secondary | ICD-10-CM

## 2024-07-01 DIAGNOSIS — R0609 Other forms of dyspnea: Secondary | ICD-10-CM

## 2024-07-01 MED ORDER — ICOSAPENT ETHYL 1 G PO CAPS: 2.0000 g | ORAL_CAPSULE | Freq: Two times a day (BID) | ORAL | 3 refills | Status: DC

## 2024-07-01 NOTE — Progress Notes (Signed)
 Cardiology Office Note  Date: 07/01/2024   ID: Thresa, Dozier [birth year], MRN [id number]  PCP:  Rosamond Leta NOVAK, MD  Cardiologist:  Diannah SHAUNNA Maywood, MD Electrophysiologist:  None   History of Present Illness: Samantha Mcconnell is a 68 y.o. female  Referred to cardiology clinic for relation of dizziness.  She reported dizziness as a room spinning sensation..  She has a chronic sinus issues and also longstanding cough.  She has shortness of breath for a long time which she thinks is from sinus issues.  Echo from 2021 was within normal limits, done at [facility].  She also is having chest pains lasting for few seconds, not angina.  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  I reviewed and discussed EKG today with the patient.  Nonspecific T wave inversions in the anterior leads.  Unchanged from compared to prior EKG from 2018.  EKG today in the clinic also showed nonspecific T wave changes in the anterior leads.  PR interval is 192 ms, no evidence of first-degree AV block is noted.  Past Medical History:  Diagnosis Date   Dyslipidemia    High triglycerides    Hypertension     Past Surgical History:  Procedure Laterality Date   endometrial surgery     ORIF ANKLE FRACTURE Left 12/07/2014   Procedure: OPEN TREATMENT INTERNAL FIXATION (ORIF) LEFT TRIMALEOR ANKLE FRACTURE;  Surgeon: Taft FORBES Minerva, MD;  Location: [facility];  Service: Orthopedics;  Laterality: Left;   TONSILLECTOMY AND ADENOIDECTOMY      Current Outpatient Medications  Medication Sig Dispense Refill   ALBUTEROL  SULFATE IN Inhale into the lungs.     Apple Cider Vinegar 600 MG CAPS Take 1,200 mg by mouth daily.     aspirin  EC 81 MG tablet Take 81 mg by mouth daily. Swallow whole.     bisoprolol  (ZEBETA ) 5 MG tablet Take 1 tablet (5 mg total) by mouth daily. 30 tablet 11   Ca  Phosphate-Cholecalciferol (CALCIUM/VITAMIN D3 GUMMIES PO) Take by mouth.     famotidine  (PEPCID ) 20 MG tablet One after supper 30 tablet 11   fenofibrate  (TRICOR ) 145 MG tablet Take 145 mg by mouth daily.     icosapent  Ethyl (VASCEPA ) 1 g capsule Take 2 capsules (2 g total) by mouth 2 (two) times daily. 360 capsule 3   levocetirizine (XYZAL) 5 MG tablet Take 5 mg by mouth daily.     naproxen sodium (ALEVE) 220 MG tablet Take 220 mg by mouth 2 (two) times daily as needed.     pantoprazole  (PROTONIX ) 40 MG tablet TAKE 1 TABLET BY MOUTH DAILY take 30-60 MINUTES BEFORE first meal of THE DAY 30 tablet 5   pravastatin  (PRAVACHOL ) 80 MG tablet Take 80 mg by mouth daily.     predniSONE  (DELTASONE ) 5 MG tablet Take 5 mg by mouth daily with breakfast.     triamterene -hydrochlorothiazide  (DYAZIDE ) 37.5-25 MG capsule Take 1 capsule by mouth daily.     azithromycin  (ZITHROMAX ) 250 MG tablet Take 1 tablet (250 mg total) by mouth daily. (Patient not taking: Reported on 07/01/2024) 6 tablet 0   HYDROcodone -homatropine (HYCODAN) 5-1.5 MG/5ML syrup Take 5 mLs by mouth every 6 (six) hours as needed for cough. (Patient not taking: Reported on 07/01/2024)     No current facility-administered medications for this visit.   Allergies:  Patient has  no known allergies.   Social History: The patient  reports that she has never smoked. She has never used smokeless tobacco. She reports that she does not drink alcohol  and does not use drugs.   Family History: The patient's family history includes Hypertension in her father; Leukemia in her mother and another family member; Stroke in her father.   ROS:  Please see the history of present illness. Otherwise, complete review of systems is positive for none  All other systems are reviewed and negative.   Physical Exam: VS:  BP 136/80 (BP Location: Left Arm, Cuff Size: Large)   Pulse 68   Wt 200 lb (90.7 kg)   SpO2 96%   BMI 32.28 kg/m , BMI Body mass index is 32.28  kg/m.  Wt Readings from Last 3 Encounters:  07/01/24 200 lb (90.7 kg)  07/27/21 179 lb 1.3 oz (81.2 kg)  01/29/17 160 lb (72.6 kg)    General: Patient appears comfortable at rest. HEENT: Conjunctiva and lids normal, oropharynx clear with moist mucosa. Neck: Supple, no elevated JVP or carotid bruits, no thyromegaly. Lungs: Clear to auscultation, nonlabored breathing at rest. Cardiac: Regular rate and rhythm, no S3 or significant systolic murmur, no pericardial rub. Abdomen: Soft, nontender, no hepatomegaly, bowel sounds present, no guarding or rebound. Extremities: No pitting edema, distal pulses 2+. Skin: Warm and dry. Musculoskeletal: No kyphosis. Neuropsychiatric: Alert and oriented x3, affect grossly appropriate.  Recent Labwork: No results found for requested labs within last 365 days.  No results found for: CHOL, TRIG, HDL, CHOLHDL, VLDL, LDLCALC, LDLDIRECT   Assessment and Plan:  Dizziness from vertigo: Patient describes dizziness as room spinning sensation.  This is noncardiac.  Follow-up with PCP.  Chest pain: Likely noncardiac.  Lasts for only few seconds.  I discussed with her the symptoms of CAD and MI.  ER precautions for chest pain provided.  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  No indication for further cardiac workup at this time.  DOE: Longstanding DOE from sinus issues.  Will update echocardiogram.  Prior echo from 2021 within normal meds.  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  Hypertriglyceridemia: Lipid panel reviewed.  TG 300s, elevated.  Add Vascepa 2 g twice daily.  Currently taking pravastatin  80 mg nightly and fenofibrate  145 mg once daily, which we will continue.  Repeat lipid panel in 6 months before the next clinic visit.  HTN, controlled: Continue current antihypertensives, bisoprolol  5 mg once  daily, triamterene -HCTZ 37.5-25 mg once daily.   40 minutes spent in reviewing prior medical records, more than 3 labs, discussion and documentation.   Medication Adjustments/Labs and Tests Ordered: Current medicines are reviewed at length with the patient today.  Concerns regarding medicines are outlined above.    Disposition:  Follow up 6 months  Signed Bernetha Anschutz Priya Kennidy Lamke, MD, 07/01/2024 4:43 PM    [facility] [address] [place], [place], [place] [address]

## 2024-07-01 NOTE — Patient Instructions (Signed)
 Medication Instructions:   Your physician has recommended you make the following change in your medication:   -Start Vasceoa 2g twice daily   *If you need a refill on your cardiac medications before your next appointment, please call your pharmacy*  Lab Work: In 6 months:  -Fasting Lipid Panel- nothing to eat or drink 6 hours prior to lab work  If you have labs (blood work) drawn today and your tests are completely normal, you will receive your results only by: MyChart Message (if you have MyChart) OR A paper copy in the mail If you have any lab test that is abnormal or we need to change your treatment, we will call you to review the results.  Testing/Procedures: None  Follow-Up: At [facility], you and your health needs are our priority.  As part of our continuing mission to provide you with exceptional heart care, our providers are all part of one team.  This team includes your primary Cardiologist (physician) and Advanced Practice Providers or APPs (Physician Assistants and Nurse Practitioners) who all work together to provide you with the care you need, when you need it.  Your next appointment:   6 month(s)  Provider:   You may see Vishnu P Mallipeddi, MD or the following Advanced Practice Provider on your designated Care Team:   Almarie Crate, NP    We recommend signing up for the patient portal called MyChart.  Sign up information is provided on this After Visit Summary.  MyChart is used to connect with patients for Virtual Visits (Telemedicine).  Patients are able to view lab/test results, encounter notes, upcoming appointments, etc.  Non-urgent messages can be sent to your provider as well.   To learn more about what you can do with MyChart, go to [email or web address].   Other Instructions

## 2024-07-02 MED ORDER — ICOSAPENT ETHYL 1 G PO CAPS
2.0000 g | ORAL_CAPSULE | Freq: Two times a day (BID) | ORAL | 3 refills | Status: AC
Start: 2024-07-02 — End: ?

## 2024-07-02 NOTE — Addendum Note (Signed)
 Addended by: LEIGH JON MATSU on: 07/02/2024 08:31 AM   Modules accepted: Orders

## 2024-07-13 ENCOUNTER — Telehealth: Payer: Self-pay | Admitting: Pharmacy Technician

## 2024-07-13 ENCOUNTER — Other Ambulatory Visit (HOSPITAL_COMMUNITY): Payer: Self-pay

## 2024-07-13 ENCOUNTER — Telehealth: Payer: Self-pay | Admitting: Internal Medicine

## 2024-07-13 NOTE — Telephone Encounter (Signed)
  Submitted diag viag on grant   Patient Advocate Encounter   The patient was approved for a [employer] grant that will help cover the cost of vascepa  Total amount awarded, 2500.  Effective: 06/13/24 - 06/12/25   APW:[id number] ERW:[id number] Hmnle:[id number] PI:[id number] [employer] ID: [id number]   Pharmacy provided with approval and processing information. Patient informed via mychart once passes diag

## 2024-07-13 NOTE — Telephone Encounter (Signed)
 Pt c/o medication issue:  1. Name of Medication:   icosapent  Ethyl (VASCEPA ) 1 g capsule    2. How are you currently taking this medication (dosage and times per day)? As written   3. Are you having a reaction (difficulty breathing--STAT)? No   4. What is your medication issue? Pt called in stating she cannot afford this med, she states its showing to be around $300. Please advise of pt assistance options.

## 2024-07-13 NOTE — Telephone Encounter (Signed)
   Submitted diag viag on grant   Patient Advocate Encounter   The patient was approved for a [employer] grant that will help cover the cost of vascepa  Total amount awarded, 2500.  Effective: 06/13/24 - 06/12/25   APW:[id number] ERW:[id number] Hmnle:[id number] PI:[id number] [employer] ID: [id number]   Pharmacy provided with approval and processing information. Patient informed via mychart once passes diag

## 2024-07-15 NOTE — Telephone Encounter (Signed)
 Still pending

## 2024-07-19 NOTE — Telephone Encounter (Signed)
 Spoke to hw and they asked me to fax diag verfication  Faxed 07/19/24

## 2024-07-21 NOTE — Telephone Encounter (Signed)
 Patient Advocate Encounter   The patient was approved for a [employer] grant that will help cover the cost of vascepa  Total amount awarded, 2500.  Effective: 06/13/24 - 06/12/25   APW:[id number] ERW:EKKEIFP Hmnle:[id number] PI:[id number] [employer] ID: [id number]   Pharmacy provided with approval and processing information. Patient informed via mychart
